# Patient Record
Sex: Female | Born: 1975 | Race: White | Hispanic: Yes | Marital: Single | Smoking: Never smoker
Health system: Southern US, Community
[De-identification: ages and names within clinical notes are randomized; demographics above are authoritative.]

## PROBLEM LIST (undated history)

## (undated) DIAGNOSIS — F329 Major depressive disorder, single episode, unspecified: Secondary | ICD-10-CM

## (undated) DIAGNOSIS — F32A Depression, unspecified: Secondary | ICD-10-CM

---

## 2004-10-05 ENCOUNTER — Emergency Department (HOSPITAL_COMMUNITY): Admission: EM | Admit: 2004-10-05 | Discharge: 2004-10-05 | Payer: Self-pay | Admitting: Emergency Medicine

## 2004-10-27 ENCOUNTER — Inpatient Hospital Stay (HOSPITAL_COMMUNITY): Admission: AD | Admit: 2004-10-27 | Discharge: 2004-10-27 | Payer: Self-pay | Admitting: *Deleted

## 2004-11-13 HISTORY — PX: TUBAL LIGATION: SHX77

## 2004-11-24 ENCOUNTER — Ambulatory Visit (HOSPITAL_COMMUNITY): Admission: RE | Admit: 2004-11-24 | Discharge: 2004-11-24 | Payer: Self-pay | Admitting: *Deleted

## 2005-03-28 ENCOUNTER — Inpatient Hospital Stay (HOSPITAL_COMMUNITY): Admission: AD | Admit: 2005-03-28 | Discharge: 2005-03-28 | Payer: Self-pay | Admitting: *Deleted

## 2005-03-28 ENCOUNTER — Ambulatory Visit: Payer: Self-pay | Admitting: *Deleted

## 2005-04-07 ENCOUNTER — Inpatient Hospital Stay (HOSPITAL_COMMUNITY): Admission: AD | Admit: 2005-04-07 | Discharge: 2005-04-09 | Payer: Self-pay | Admitting: Obstetrics & Gynecology

## 2005-04-07 ENCOUNTER — Inpatient Hospital Stay (HOSPITAL_COMMUNITY): Admission: AD | Admit: 2005-04-07 | Discharge: 2005-04-07 | Payer: Self-pay | Admitting: Obstetrics & Gynecology

## 2005-12-23 ENCOUNTER — Emergency Department (HOSPITAL_COMMUNITY): Admission: EM | Admit: 2005-12-23 | Discharge: 2005-12-23 | Payer: Self-pay | Admitting: Family Medicine

## 2006-04-28 ENCOUNTER — Emergency Department (HOSPITAL_COMMUNITY): Admission: EM | Admit: 2006-04-28 | Discharge: 2006-04-28 | Payer: Self-pay | Admitting: Emergency Medicine

## 2007-03-13 ENCOUNTER — Encounter: Admission: RE | Admit: 2007-03-13 | Discharge: 2007-03-13 | Payer: Self-pay | Admitting: Obstetrics and Gynecology

## 2007-04-30 ENCOUNTER — Encounter: Admission: RE | Admit: 2007-04-30 | Discharge: 2007-04-30 | Payer: Self-pay | Admitting: Obstetrics and Gynecology

## 2007-11-12 ENCOUNTER — Emergency Department (HOSPITAL_COMMUNITY): Admission: EM | Admit: 2007-11-12 | Discharge: 2007-11-13 | Payer: Self-pay | Admitting: Emergency Medicine

## 2007-11-15 ENCOUNTER — Inpatient Hospital Stay (HOSPITAL_COMMUNITY): Admission: AD | Admit: 2007-11-15 | Discharge: 2007-11-15 | Payer: Self-pay | Admitting: Obstetrics and Gynecology

## 2008-01-10 ENCOUNTER — Ambulatory Visit (HOSPITAL_COMMUNITY): Admission: RE | Admit: 2008-01-10 | Discharge: 2008-01-10 | Payer: Self-pay | Admitting: Family Medicine

## 2008-02-06 ENCOUNTER — Ambulatory Visit (HOSPITAL_COMMUNITY): Admission: RE | Admit: 2008-02-06 | Discharge: 2008-02-06 | Payer: Self-pay | Admitting: Gynecology

## 2008-04-19 ENCOUNTER — Inpatient Hospital Stay (HOSPITAL_COMMUNITY): Admission: AD | Admit: 2008-04-19 | Discharge: 2008-04-20 | Payer: Self-pay | Admitting: Obstetrics & Gynecology

## 2008-04-19 ENCOUNTER — Ambulatory Visit: Payer: Self-pay | Admitting: Obstetrics and Gynecology

## 2008-04-29 IMAGING — US US OB COMP LESS 14 WK
1 series · 14 of 28 positions shown · non-contrast
Comparison: none

CLINICAL DATA: Abdominal pain and vomiting.  Positive urine pregnancy test.  
 OBSTETRICAL ULTRASOUND <14 WKS:
TECHNIQUE: Transabdominal ultrasound was performed for evaluation of the gestation as well as the maternal uterus and adnexal regions.

[Series 1: unknown · 0.28mm/px · 14 of 68 slices shown]
[im 3/68]
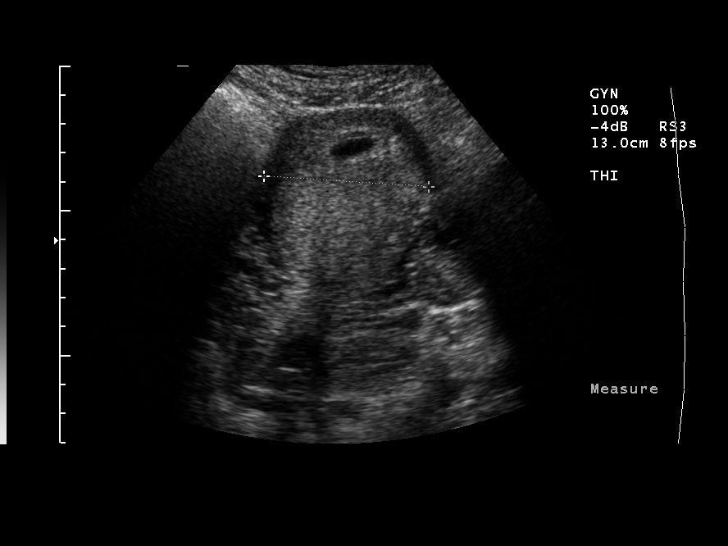
[im 8/68]
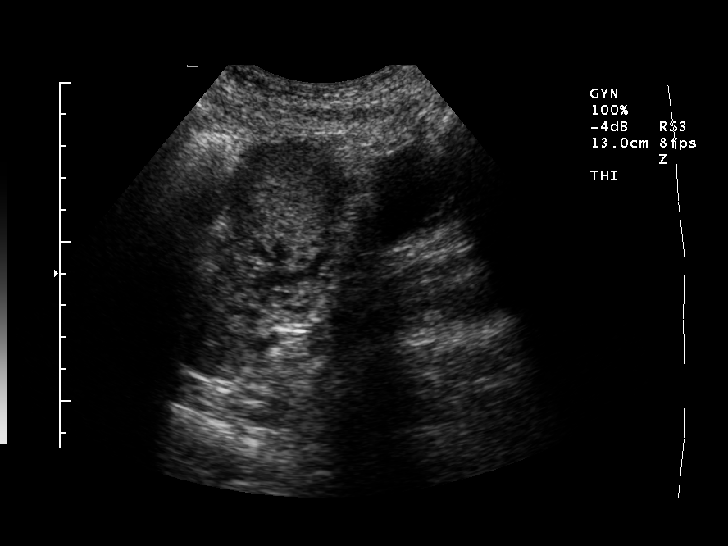
[im 13/68]
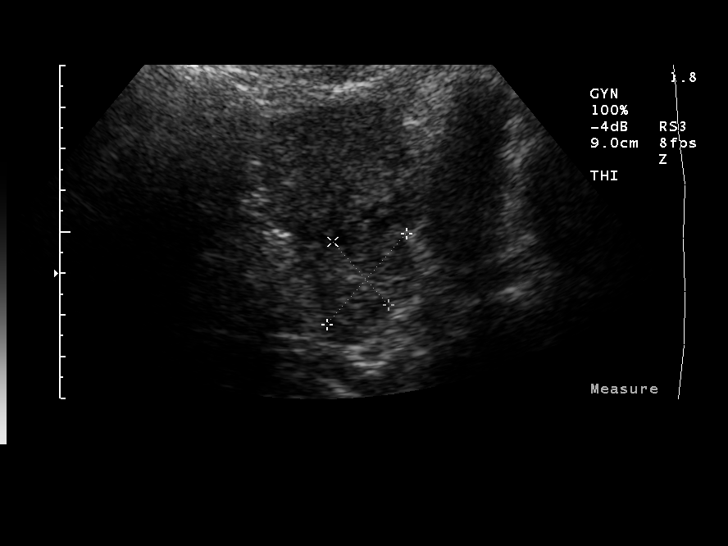
[im 18/68]
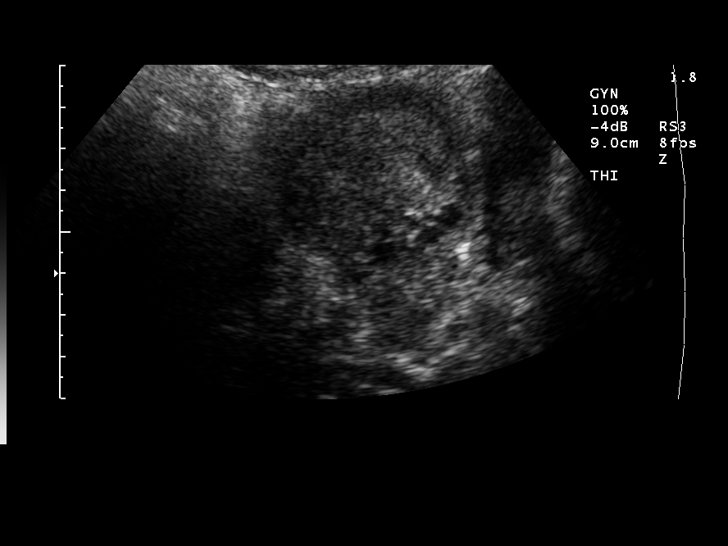
[im 23/68]
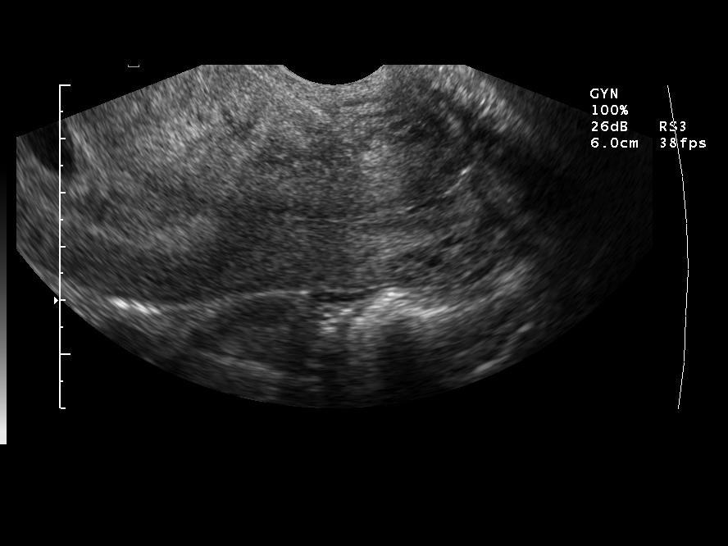
[im 28/68]
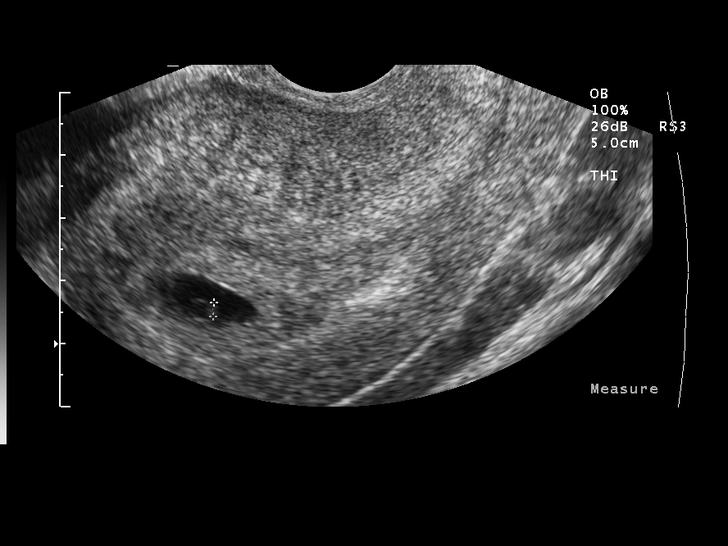
[im 33/68]
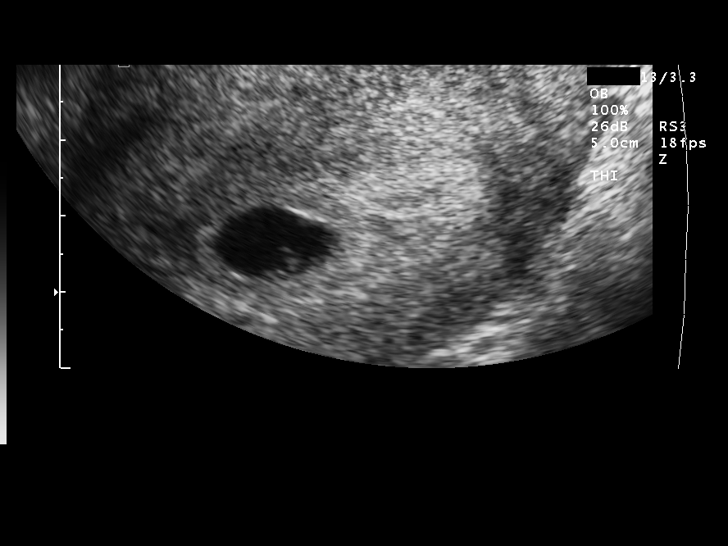
[im 38/68]
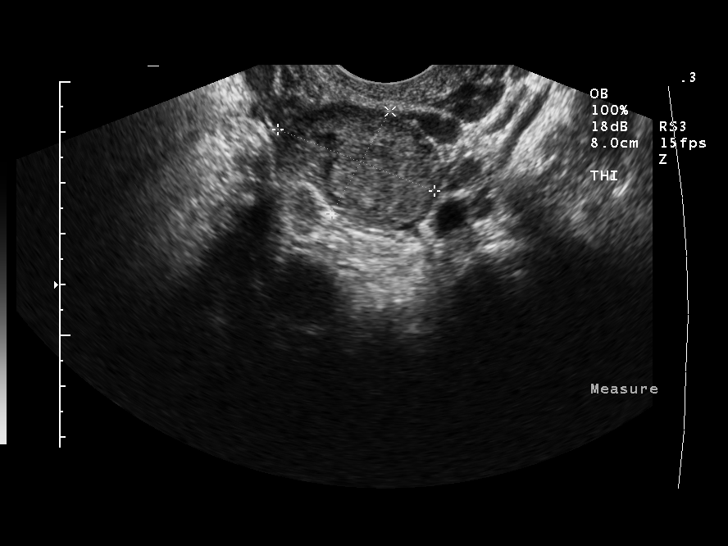
[im 43/68]
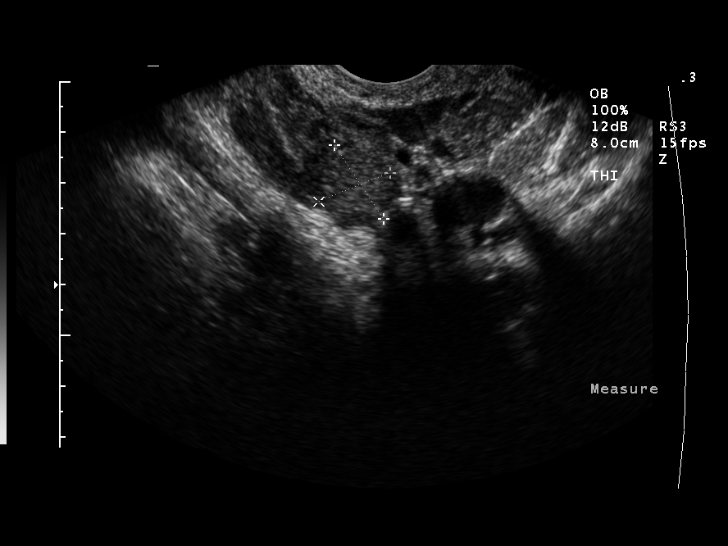
[im 48/68]
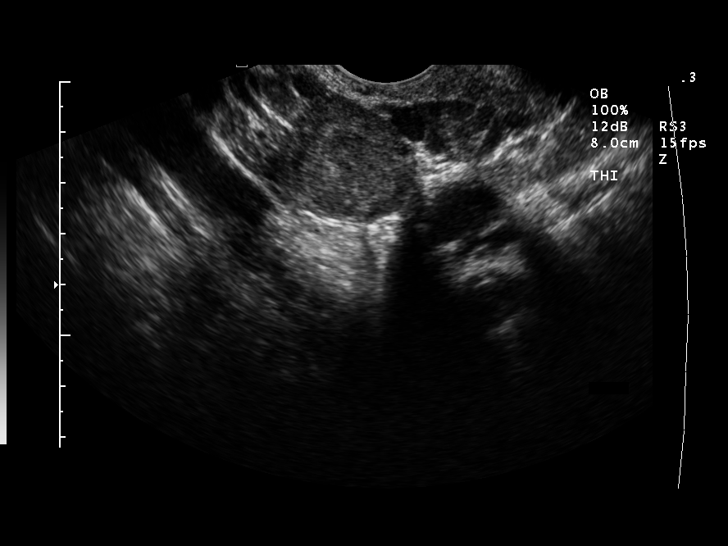
[im 53/68]
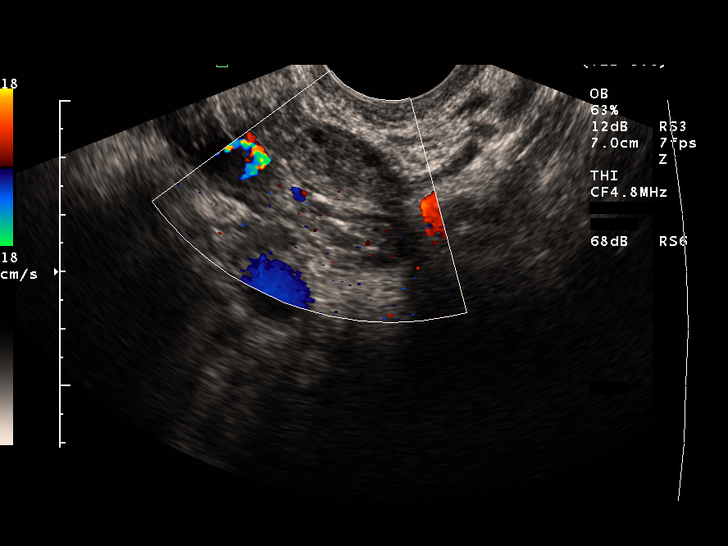
[im 58/68]
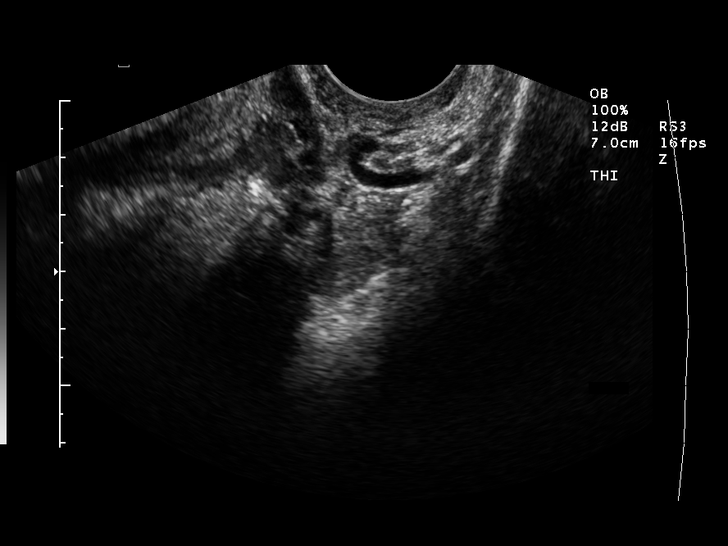
[im 63/68]
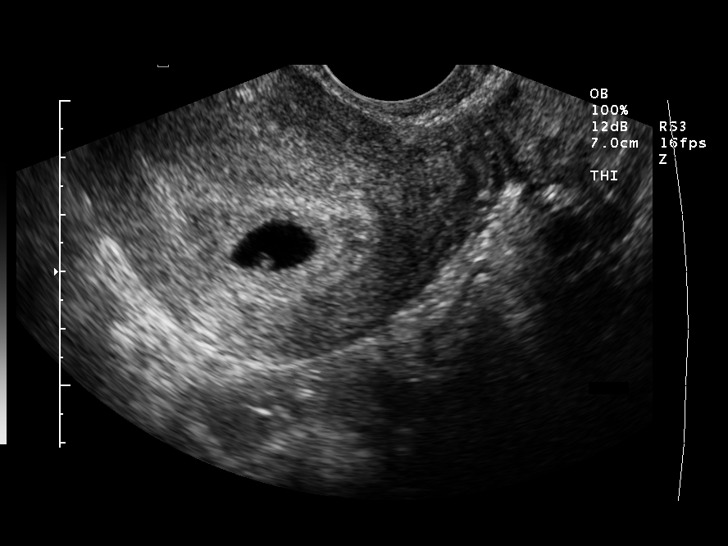
[im 68/68]
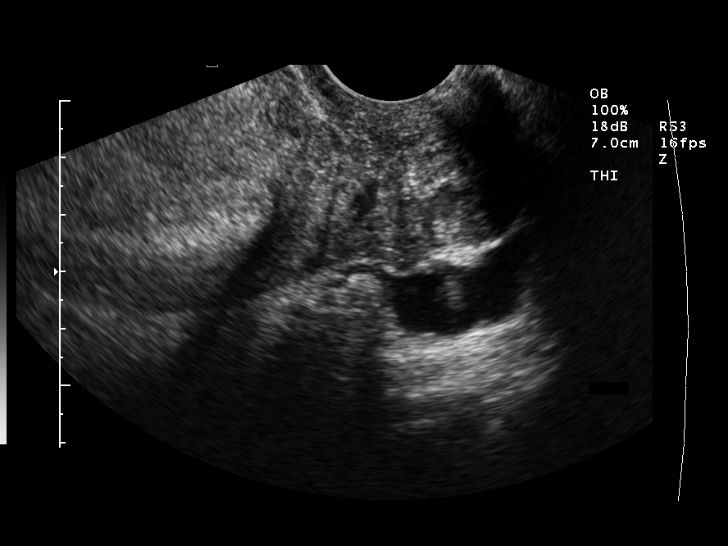

[14 of 28 positions shown; findings below may reference images not displayed]

FINDINGS: There is a single intrauterine pregnancy.  There is an embryo with a crown-rump length of 2.6 mm consistent with 5 weeks 6 days.  There is a heart rate of 104 bpm.  
 There is a trace amount of free fluid in the pelvis.
IMPRESSION: 1.  Intrauterine pregnancy of approximately 5 weeks 6 days gestation.  No evidence of hemorrhage.  
 2.  Vague area of echogenicity in the right ovary which probably represents a hemorrhagic cyst but follow-up is recommended.

## 2008-07-03 ENCOUNTER — Inpatient Hospital Stay (HOSPITAL_COMMUNITY): Admission: AD | Admit: 2008-07-03 | Discharge: 2008-07-03 | Payer: Self-pay | Admitting: Obstetrics & Gynecology

## 2008-07-03 ENCOUNTER — Inpatient Hospital Stay (HOSPITAL_COMMUNITY): Admission: AD | Admit: 2008-07-03 | Discharge: 2008-07-06 | Payer: Self-pay | Admitting: Obstetrics & Gynecology

## 2008-07-03 ENCOUNTER — Ambulatory Visit: Payer: Self-pay | Admitting: Obstetrics and Gynecology

## 2008-07-05 ENCOUNTER — Encounter: Payer: Self-pay | Admitting: Obstetrics & Gynecology

## 2008-07-05 ENCOUNTER — Ambulatory Visit: Payer: Self-pay | Admitting: Vascular Surgery

## 2008-07-08 ENCOUNTER — Emergency Department (HOSPITAL_COMMUNITY): Admission: EM | Admit: 2008-07-08 | Discharge: 2008-07-09 | Payer: Self-pay | Admitting: Emergency Medicine

## 2008-07-10 ENCOUNTER — Emergency Department (HOSPITAL_COMMUNITY): Admission: EM | Admit: 2008-07-10 | Discharge: 2008-07-10 | Payer: Self-pay | Admitting: Emergency Medicine

## 2008-07-10 ENCOUNTER — Encounter (INDEPENDENT_AMBULATORY_CARE_PROVIDER_SITE_OTHER): Payer: Self-pay | Admitting: Emergency Medicine

## 2008-09-07 ENCOUNTER — Ambulatory Visit: Payer: Self-pay | Admitting: Hematology

## 2010-03-25 ENCOUNTER — Emergency Department (HOSPITAL_COMMUNITY): Admission: EM | Admit: 2010-03-25 | Discharge: 2010-03-25 | Payer: Self-pay | Admitting: Emergency Medicine

## 2011-03-31 NOTE — Op Note (Signed)
NAMEChryl Krueger              ACCOUNT NO.:  [id number]   MEDICAL RECORD NO.:  [id number]          PATIENT TYPE:  INP   LOCATION:  9130                          FACILITY:  [facility]   PHYSICIAN:  Lesly Dukes, M.D. DATE OF BIRTH:  1976-05-02   DATE OF PROCEDURE:  04/08/2005  DATE OF DISCHARGE:                                 OPERATIVE REPORT   PREOPERATIVE DIAGNOSIS:  The patient is a 35 year old G3, P3 female desiring  permanent sterilization.   POSTOPERATIVE DIAGNOSIS:  The patient is a 34 year old G47, P3 female  desiring permanent sterilization.   PROCEDURE:  Postpartum bilateral tubal ligation.   SURGEON:  Dr. Elsie Lincoln   ANESTHESIA:  Spinal.   PATHOLOGY:  None.   ESTIMATED BLOOD LOSS:  Minimal.   COMPLICATIONS:  None.   DESCRIPTION OF PROCEDURE:  After informed consent was obtained, using  _______ interpreter, the patient was taken to the operating room where  spinal anesthesia was found to be adequate.  The patient in the dorsal  supine position, and the bladder was in-and-out catheterized.  The abdomen  was prepared and draped in the normal sterile fashion.  An infraumbilical  skin incision was made with a scalpel and carried down bluntly to the  peritoneum.  The peritoneum was entered bluntly and the incision extended  both superiorly and inferiorly.  Retractors were placed in the abdomen, and  the right fallopian tube was grasped with a Babcock clamp, tented up, and  followed out to its fimbriated end.  A Filshie clip was placed across the  entire tube.  There was good hemostasis, and the tube was returned to the  abdomen.  The left fallopian tube was then identified, grasped with a  Babcock, clamped, tented up, and followed out to its fimbriated end.  A  Filshie clip was placed across the entire fallopian tube.  Good hemostasis  was noted.  The tube was returned to the abdomen.  Good hemostasis was  assured one last time.  The fascia was then closed with 0  Vicryl in a  running fashion.  The skin was closed with 4-0 Vicryl in a subcuticular  fashion.  Good hemostasis was noted.  The patient tolerated the procedure  well.  Sponge, lap, instrument, and needle count were correct x 2.  The  patient went to the recovery room in stable condition.      KHL/MEDQ  D:  04/08/2005  T:  04/08/2005  Job:  [id number]

## 2011-08-02 LAB — URINALYSIS, ROUTINE W REFLEX MICROSCOPIC
Bilirubin Urine: NEGATIVE
Glucose, UA: NEGATIVE
Ketones, ur: 15 — AB
Nitrite: NEGATIVE
Protein, ur: NEGATIVE
Specific Gravity, Urine: 1.025
Urobilinogen, UA: 0.2
pH: 5

## 2011-08-02 LAB — URINE MICROSCOPIC-ADD ON

## 2011-08-10 LAB — FETAL FIBRONECTIN: Fetal Fibronectin: NEGATIVE

## 2011-08-18 LAB — WET PREP, GENITAL
Clue Cells Wet Prep HPF POC: NONE SEEN
Trich, Wet Prep: NONE SEEN
Yeast Wet Prep HPF POC: NONE SEEN

## 2011-08-18 LAB — DIFFERENTIAL
Basophils Absolute: 0.1
Basophils Relative: 1
Eosinophils Absolute: 0.1
Eosinophils Relative: 1
Lymphocytes Relative: 25
Lymphs Abs: 2.4
Monocytes Absolute: 0.6
Monocytes Relative: 6
Neutro Abs: 6.5
Neutrophils Relative %: 67

## 2011-08-18 LAB — HCG, QUANTITATIVE, PREGNANCY: hCG, Beta Chain, Quant, S: 16845 — ABNORMAL HIGH

## 2011-08-18 LAB — URINALYSIS, ROUTINE W REFLEX MICROSCOPIC
Bilirubin Urine: NEGATIVE
Glucose, UA: NEGATIVE
Hgb urine dipstick: NEGATIVE
Ketones, ur: NEGATIVE
Nitrite: NEGATIVE
Protein, ur: NEGATIVE
Specific Gravity, Urine: 1.021
Urobilinogen, UA: 1
pH: 7

## 2011-08-18 LAB — GC/CHLAMYDIA PROBE AMP, GENITAL
Chlamydia, DNA Probe: NEGATIVE
GC Probe Amp, Genital: NEGATIVE

## 2011-08-18 LAB — PREGNANCY, URINE: Preg Test, Ur: POSITIVE

## 2011-08-18 LAB — CBC
HCT: 40.2
Hemoglobin: 13.5
MCHC: 33.6
MCV: 94.6
Platelets: 195
RBC: 4.25
RDW: 13.6
WBC: 9.7

## 2011-08-18 LAB — RPR: RPR Ser Ql: NONREACTIVE

## 2011-08-18 LAB — URINE MICROSCOPIC-ADD ON

## 2012-02-15 ENCOUNTER — Encounter (HOSPITAL_COMMUNITY): Payer: Self-pay | Admitting: Emergency Medicine

## 2012-02-15 ENCOUNTER — Emergency Department (HOSPITAL_COMMUNITY)
Admission: EM | Admit: 2012-02-15 | Discharge: 2012-02-16 | Disposition: A | Discharge status: Home / Self Care | Payer: Self-pay | Attending: Emergency Medicine | Admitting: Emergency Medicine

## 2012-02-15 DIAGNOSIS — M79609 Pain in unspecified limb: Secondary | ICD-10-CM | POA: Insufficient documentation

## 2012-02-15 DIAGNOSIS — M79606 Pain in leg, unspecified: Secondary | ICD-10-CM

## 2012-02-15 DIAGNOSIS — R079 Chest pain, unspecified: Secondary | ICD-10-CM | POA: Insufficient documentation

## 2012-02-15 DIAGNOSIS — R0781 Pleurodynia: Secondary | ICD-10-CM

## 2012-02-15 LAB — CBC
Hemoglobin: 14.7 g/dL (ref 12.0–15.0)
MCH: 31.6 pg (ref 26.0–34.0)
MCHC: 36.8 g/dL — ABNORMAL HIGH (ref 30.0–36.0)
Platelets: 193 10*3/uL (ref 150–400)
Platelets: 204 10*3/uL (ref 150–400)
RBC: 4.71 MIL/uL (ref 3.87–5.11)
RDW: 13.1 % (ref 11.5–15.5)
RDW: 13 % (ref 11.5–15.5)
WBC: 10.4 10*3/uL (ref 4.0–10.5)

## 2012-02-15 LAB — DIFFERENTIAL
Basophils Absolute: 0 10*3/uL (ref 0.0–0.1)
Basophils Relative: 0 % (ref 0–1)
Eosinophils Absolute: 0.1 10*3/uL (ref 0.0–0.7)
Neutro Abs: 6.1 10*3/uL (ref 1.7–7.7)
Neutrophils Relative %: 62 % (ref 43–77)

## 2012-02-15 LAB — URINE MICROSCOPIC-ADD ON

## 2012-02-15 LAB — BASIC METABOLIC PANEL
Chloride: 103 mEq/L (ref 96–112)
GFR calc Af Amer: >90 mL/min (ref >90)
GFR calc non Af Amer: >90 mL/min (ref >90)
Potassium: 4 mEq/L (ref 3.5–5.1)

## 2012-02-15 LAB — URINALYSIS, ROUTINE W REFLEX MICROSCOPIC
Ketones, ur: NEGATIVE mg/dL
Nitrite: NEGATIVE
Protein, ur: NEGATIVE mg/dL
Urobilinogen, UA: 1 mg/dL (ref 0.0–1.0)
pH: 8 (ref 5.0–8.0)

## 2012-02-15 LAB — POCT PREGNANCY, URINE: Preg Test, Ur: NEGATIVE

## 2012-02-15 NOTE — ED Notes (Signed)
PT. REPORTS LEFT ABDOMINAL Gilmore Laroche PAIN ONSET TODAY WITH HEADACHE AND LEFT THIGH PAIN . DENIES NAUSEA OR VOMITTING , NO FEVER OR CHILLS.

## 2012-02-15 NOTE — ED Notes (Signed)
MD at bedside for assessment

## 2012-02-15 NOTE — ED Notes (Signed)
Patient denies nausea/vomiting or diarrhea, fever, or chills.

## 2012-02-16 ENCOUNTER — Emergency Department (HOSPITAL_COMMUNITY): Payer: Self-pay

## 2012-02-16 MED ORDER — TRAMADOL HCL 50 MG PO TABS: 50.0000 mg | ORAL_TABLET | Freq: Four times a day (QID) | ORAL | Status: AC | PRN

## 2012-02-16 MED ORDER — TRAMADOL HCL 50 MG PO TABS
50.0000 mg | ORAL_TABLET | Freq: Once | ORAL | Status: AC
  Administered 2012-02-16: 50 mg via ORAL
  Filled 2012-02-16: qty 1

## 2012-02-16 NOTE — ED Provider Notes (Addendum)
History     CSN: [id number]  Arrival date & time 02/15/12  1919   First MD Initiated Contact with Patient 02/15/12 2209      Chief Complaint  Patient presents with  . Abdominal Pain    (Consider location/radiation/quality/duration/timing/severity/associated sxs/prior treatment) HPI Comments: PT. REPORTS LEFT ABDOMINAL /FLANK PAIN ONSET TODAY WITH HEADACHE AND LEFT THIGH PAIN . DENIES NAUSEA OR VOMITTING , NO FEVER OR CHILLS.  Has taken over-the-counter ibuprofen, without relief  The history is provided by the patient.    History reviewed. No pertinent past medical history.  History reviewed. No pertinent past surgical history.  No family history on file.  History  Substance Use Topics  . Smoking status: Never Smoker   . Smokeless tobacco: Not on file  . Alcohol Use: No    OB History    Grav Para Term Preterm Abortions TAB SAB Ect Mult Living                  Review of Systems  Constitutional: Negative for fever and chills.  Gastrointestinal: Positive for abdominal pain. Negative for nausea, vomiting and diarrhea.  Genitourinary: Negative for dysuria.  Musculoskeletal: Positive for myalgias. Negative for joint swelling.  Skin: Negative for pallor.  Neurological: Positive for headaches.    Allergies  Review of patient's allergies indicates no known allergies.  Home Medications   Current Outpatient Rx  Name Route Sig Dispense Refill  . IBUPROFEN 200 MG PO TABS Oral Take 200 mg by mouth every 6 (six) hours as needed. For pain or fever    . TRAMADOL HCL 50 MG PO TABS Oral Take 1 tablet (50 mg total) by mouth every 6 (six) hours as needed for pain. 15 tablet 0    BP 115/79  Pulse 77  Temp(Src) 98.2 F (36.8 C) (Oral)  Resp 16  SpO2 98%  LMP 02/03/2012  Physical Exam  Constitutional: She is oriented to person, place, and time. She appears well-developed and well-nourished.  Eyes: Pupils are equal, round, and reactive to light.  Cardiovascular: Normal  rate.   Pulmonary/Chest: Effort normal.  Abdominal: Soft. She exhibits no distension. There is no tenderness.  Neurological: She is alert and oriented to person, place, and time.    ED Course  Procedures (including critical care time)  Labs Reviewed  URINALYSIS, ROUTINE W REFLEX MICROSCOPIC - Abnormal; Notable for the following:    APPearance CLOUDY (*)    Leukocytes, UA TRACE (*)    All other components within normal limits  CBC - Abnormal; Notable for the following:    MCHC 36.8 (*)    All other components within normal limits  BASIC METABOLIC PANEL - Abnormal; Notable for the following:    Glucose, Bld 106 (*)    All other components within normal limits  URINE MICROSCOPIC-ADD ON - Abnormal; Notable for the following:    Squamous Epithelial / LPF FEW (*)    Bacteria, UA MANY (*)    All other components within normal limits  DIFFERENTIAL  POCT PREGNANCY, URINE  D-DIMER, QUANTITATIVE  CBC   Dg Chest 2 View  02/16/2012  *RADIOLOGY REPORT*  Clinical Data: Left-sided chest pain.  CHEST - 2 VIEW  Comparison: None  Findings: The cardiac silhouette, mediastinal and hilar contours are normal.  The lungs are clear.  No pleural effusion.  Bony thorax is intact.  IMPRESSION: Normal chest x-ray.  Original Report Authenticated By: P. Loralie Champagne, M.D.     1. Lower extremity pain  2. Rib pain       MDM  Nonspecific pain, left leg pain, radiating to left rib        Arman Filter, NP 02/16/12 0059  Arman Filter, NP 04/16/12 2101

## 2012-02-16 NOTE — Discharge Instructions (Signed)
Today her labs were normal your chest x-ray is normal.  We evaluated for potential blood clot in your leg, and lung tests are all negative.  You have been prescribed medication to control your pain you also been given a list of physicians.  Please try to establish regular medical care RESOURCE GUIDE  Dental Problems  Patients with Medicaid: [facility] [phone] [address]                                           [phone] [address] Phone:  [phone]                                                  Phone:  [phone]  If unable to pay or uninsured, contact:  [facility] or [facility]. to become qualified for the adult dental clinic.  Chronic Pain Problems Contact [facility]  [phone] Patients need to be referred by their primary care doctor.  Insufficient Money for Medicine Contact [employer] Way:  call "211" or [facility] [phone].  No Primary Care Doctor Call [facility]  [phone] Other agencies that provide inexpensive medical care    [facility]  [phone]    [facility]  [phone]    [facility]  [phone]    [facility]  [phone]    [facility]  [phone]    [facility]  [phone]  Psychological Services [facility]  [phone] [facility] Services  [phone] [facility] Mental Health   [phone] (emergency services [phone])  Substance Abuse Resources [facility]  [phone] [facility] [phone] [facility] [phone] Floydene Flock [phone] Residential & Outpatient Substance Abuse Program  [phone]  Abuse/Neglect [facility] Child Abuse Hotline 838-476-6106 [facility] Child Abuse Hotline [phone] (After Hours)  Emergency Shelter [facility] [phone]  Maternity Homes [facility] at [facility] [place] [facility] [phone] [facility] [phone]  MRSA Hotline #:   (567) 735-7548    [facility] Resources  [facility] [place]     [employer]                          [facility] Dept. [address] [place]                       [address]      [address]  [place]                                                Cristobal Goldmann Phone:  [phone]  Phone:  [phone]                 Phone:  [phone]  [facility] Mental [facility] Phone:  [phone]  [facility] Child Abuse Hotline 587-120-8189 [phone] (After Hours)

## 2012-02-17 NOTE — ED Provider Notes (Signed)
Medical screening examination/treatment/procedure(s) were performed by non-physician practitioner and as supervising physician I was immediately available for consultation/collaboration.  Loren Racer, MD 02/17/12 [phone]

## 2012-03-19 DIAGNOSIS — N6481 Ptosis of breast: Secondary | ICD-10-CM

## 2012-03-19 HISTORY — DX: Ptosis of breast: N64.81

## 2012-04-17 NOTE — ED Provider Notes (Signed)
Medical screening examination/treatment/procedure(s) were performed by non-physician practitioner and as supervising physician I was immediately available for consultation/collaboration.   Loren Racer, MD 04/17/12 2231

## 2014-05-13 LAB — CYTOLOGY - PAP: Pap: NEGATIVE

## 2014-08-07 ENCOUNTER — Encounter (HOSPITAL_COMMUNITY): Payer: Self-pay | Admitting: Emergency Medicine

## 2014-08-07 ENCOUNTER — Emergency Department (HOSPITAL_COMMUNITY)
Admission: EM | Admit: 2014-08-07 | Discharge: 2014-08-07 | Disposition: A | Discharge status: Home / Self Care | Payer: Medicaid Other | Attending: Emergency Medicine | Admitting: Emergency Medicine

## 2014-08-07 DIAGNOSIS — R51 Headache: Secondary | ICD-10-CM | POA: Insufficient documentation

## 2014-08-07 DIAGNOSIS — R253 Fasciculation: Secondary | ICD-10-CM

## 2014-08-07 DIAGNOSIS — R259 Unspecified abnormal involuntary movements: Secondary | ICD-10-CM | POA: Diagnosis not present

## 2014-08-07 NOTE — ED Provider Notes (Signed)
CSN: [id number]     Arrival date & time 08/07/14  1853 History  This chart was scribed for non-physician practitioner working with Flint Melter, MD by Elveria Rising, ED Scribe. This patient was seen in room TR05C/TR05C and the patient's care was started at 8:16 PM.   Chief Complaint  Patient presents with  . Headache    The history is provided by the patient. No language interpreter was used.   HPI Comments: Sara Krueger is a 38 y.o. female who presents to the Emergency Department with chief complaint of the left facial twitching located at left eyebrow, left eye (blinking) and cheek onset three days ago. Patient reports associated headache yesterday that has since resolved.  Patient reports increased stress recently. Patient denies fever, cough, sore throat, chest pain, shortness of breath, or difficulty breathing.  Patient denies alcohol or tobacco use.   History reviewed. No pertinent past medical history. History reviewed. No pertinent past surgical history. No family history on file. History  Substance Use Topics  . Smoking status: Never Smoker   . Smokeless tobacco: Not on file  . Alcohol Use: No   OB History   Grav Para Term Preterm Abortions TAB SAB Ect Mult Living                 Review of Systems  Constitutional: Negative for fever and chills.  Respiratory: Negative for cough and shortness of breath.   Cardiovascular: Negative for chest pain.  Gastrointestinal: Negative for nausea, vomiting, diarrhea and constipation.  Genitourinary: Negative for dysuria.  Neurological: Negative for facial asymmetry and headaches.      Allergies  Review of patient's allergies indicates no known allergies.  Home Medications   Prior to Admission medications   Medication Sig Start Date End Date Taking? Authorizing Provider  ibuprofen (ADVIL,MOTRIN) 200 MG tablet Take 200 mg by mouth every 6 (six) hours as needed. For pain or fever    Historical Provider, MD   Triage  Vitals: BP 126/84  Pulse 87  Temp(Src) 98.7 F (37.1 C) (Oral)  Resp 22  Ht  (1.702 m)  Wt 130 lb (58.968 kg)  BMI 20.36 kg/m2  SpO2 100%  LMP 07/17/2014  Physical Exam  Nursing note and vitals reviewed. Constitutional: She is oriented to person, place, and time. She appears well-developed and well-nourished. No distress.  HENT:  Head: Normocephalic and atraumatic.  Right Ear: External ear normal.  Left Ear: External ear normal.  Eyes: Conjunctivae and EOM are normal. Pupils are equal, round, and reactive to light.  Neck: Normal range of motion. Neck supple.  No pain with neck flexion, no meningismus  Cardiovascular: Normal rate, regular rhythm, normal heart sounds and intact distal pulses.  Exam reveals no gallop and no friction rub.   No murmur heard. Pulmonary/Chest: Effort normal and breath sounds normal. No respiratory distress. She has no wheezes. She has no rales. She exhibits no tenderness.  Abdominal: Soft. She exhibits no distension and no mass. There is no tenderness. There is no rebound and no guarding.  Musculoskeletal: Normal range of motion. She exhibits no edema and no tenderness.  Normal gait.  Neurological: She is alert and oriented to person, place, and time. She has normal reflexes. No cranial nerve deficit.  CN 3-12 intact, normal finger to nose, no pronator drift, sensation and strength intact bilaterally.  Skin: Skin is warm and dry.  Psychiatric: She has a normal mood and affect. Her behavior is normal. Judgment and thought content  normal.    ED Course  Procedures (including critical care time)  COORDINATION OF CARE: 8:18 PM- Patient advised to drink more water, get more rest and to distress if possible. Discussed treatment plan with patient at bedside and patient agreed to plan.   Labs Review Labs Reviewed - No data to display  Imaging Review No results found.   EKG Interpretation None      MDM   Final diagnoses:  Eyelid twitch     Patient symptoms consistent with blepharospasm.  Lifestyle modifications discussed.  Reducing stress.  Recommend PCP follow-up.  I personally performed the services described in this documentation, which was scribed in my presence. The recorded information has been reviewed and is accurate.    Roxy Horseman, PA-C 08/07/14 2026

## 2014-08-07 NOTE — ED Provider Notes (Signed)
Medical screening examination/treatment/procedure(s) were performed by non-physician practitioner and as supervising physician I was immediately available for consultation/collaboration.  Flint Melter, MD 08/07/14 2329

## 2014-08-07 NOTE — Discharge Instructions (Signed)
Blepharospasm Blepharospasm is an abnormal, involuntary blinking, movement, or spasm of the eyelids. CAUSES  Anyone can have an eye twitch from time to time. In most cases, there is no clear cause. Eyelid spasms may be associated with or prolonged by:   Alcohol.  Caffeine.  Fatigue.  Irritation of the eye surface or inner eyelids.  Lack of sleep.  Physical exertion.  Smoking.  Stress. Chronic, uncontrollable eyelid movement affecting both eyes is known as benign essential blepharospasm. This is not a common problem. Although its exact cause is unknown, the following conditions sometimes come before or during a problem of benign essential blepharospasm:   Eyelid infection (blepharitis).  Dry eyes.  Light sensitivity.  Pink eye (conjunctivitis). Rarely, eye twitch may be a sign of certain brain and nerve disorders. When it is, there are almost always other signs and symptoms. Brain and nerve disorders that can cause eye twitch include:   Bell's palsy.  Benign essential blepharospasm.  Dystonia.  Parkinson's Disease.  Side effects of drugs, particularly medications used to treat epilepsy and psychosis.  Spasmodic torticollis (a different type of muscle spasm sometimes accompanied by blepharospasm).  Tourette syndrome. SYMPTOMS  Random eye twitching can come and go for a few days, weeks or months. The spasms do not hurt, but they can be annoying. In its most common, harmless form, eye twitching stops on its own. It may recur off and on for no reason. The onset of blepharospasm may include:   The development of blepharospasm without any warning signs.  A gradual increase in blinking or eye irritation.  Other symptoms including:  Fatigue.  Emotional tension.  Sensitivity to bright light. Symptoms may lessen or stop while:  A person is sleeping.  Concentrating on a specific task. If the condition progresses, the symptoms become more frequent. Facial spasms may  develop. This is rare and may be the earliest sign of a more chronic movement disorder. TREATMENT  To date, there is no successful cure. Several treatment options can reduce its severity.  In some places, Botox shots into the muscles of the eyelids is an approved treatment. Botulinum toxin temporarily paralyzes the muscles of the eyelids.  Other medications can have unpredictable results. Any symptom relief is usually short term. They tend to be helpful in only a small percentage of cases.  A surgical procedure to remove some of the muscles and nerves of the eyelids (myectomy) is also an option. This surgery has improved symptoms in 75 to 85 percent of people.  Alternative treatments (the benefits of these alternative therapies have not been proven) may include:  Biofeedback.  Acupuncture.  Hypnosis.  Chiropractic treatment.  Nutritional therapy. FOR MORE INFORMATION National Eye Institute: [email or web address] Document Released: 11/02/2003 Document Revised: 01/22/2012 Document Reviewed: 09/17/2007 [facility] Patient Information 2015 [place], [place]. This information is not intended to replace advice given to you by your health care provider. Make sure you discuss any questions you have with your health care provider.

## 2014-08-07 NOTE — ED Notes (Signed)
Sx onset 3d ago. Reports recent HA (resolved), c/o facial twitching. Not noticeable at this time. Pinpoints to L eye brow, L face and L tongue. Speaks English, not primary language. Neuro intact. PERRLA. EOMI. Facial symmetry, no droop. Speech clear.

## 2016-05-19 ENCOUNTER — Encounter: Payer: Self-pay | Admitting: *Deleted

## 2016-06-07 ENCOUNTER — Encounter: Payer: Self-pay | Admitting: Obstetrics & Gynecology

## 2016-06-07 ENCOUNTER — Other Ambulatory Visit (HOSPITAL_COMMUNITY)
Admission: RE | Admit: 2016-06-07 | Discharge: 2016-06-07 | Disposition: A | Discharge status: Home / Self Care | Payer: Medicaid Other | Source: Ambulatory Visit | Attending: Obstetrics & Gynecology | Admitting: Obstetrics & Gynecology

## 2016-06-07 ENCOUNTER — Ambulatory Visit (INDEPENDENT_AMBULATORY_CARE_PROVIDER_SITE_OTHER): Payer: Medicaid Other | Admitting: Obstetrics & Gynecology

## 2016-06-07 VITALS — BP 125/87 | HR 86 | Ht 67.0 in | Wt 138.0 lb

## 2016-06-07 DIAGNOSIS — Z3009 Encounter for other general counseling and advice on contraception: Secondary | ICD-10-CM

## 2016-06-07 DIAGNOSIS — N930 Postcoital and contact bleeding: Secondary | ICD-10-CM | POA: Diagnosis not present

## 2016-06-07 DIAGNOSIS — Z711 Person with feared health complaint in whom no diagnosis is made: Secondary | ICD-10-CM

## 2016-06-07 DIAGNOSIS — Z113 Encounter for screening for infections with a predominantly sexual mode of transmission: Secondary | ICD-10-CM | POA: Insufficient documentation

## 2016-06-07 NOTE — Progress Notes (Signed)
   Subjective:    Patient ID: Sara Krueger, female    DOB: [birth year], 40 y.o.   MRN: [id number]  HPI 96 S H P4 (18, 12, 10, and 13 yo kids) here today to discuss contraception. She has had a BTL after her 3rd child but it failed and then she had her 4th. Since then she tried depo provera (didn't feel well with it), OCPs (also didn't feel well with this), Mirena (caused her a lot of pain). She had it removed after about 6 months. She is now using condoms. But one broke about 2 weeks ago. She used Plan B and has had her period since then.  She has had post coital bleeding for about 6 months. She believes that they are monogamous for about 14 months.   Review of Systems     Objective:   Physical Exam WNWHHFNAD Breathing, conversing, and ambulating normally Abd- benign Ectropion noted of cervix Normal vaginal discharge       Assessment & Plan:  FAILED BTL- plan for l/s BS. She will sign BTL forms today  PCB- check cervical cultures Cryo prn

## 2016-06-08 ENCOUNTER — Encounter: Payer: Self-pay | Admitting: *Deleted

## 2016-06-08 LAB — GC/CHLAMYDIA PROBE AMP (~~LOC~~) NOT AT ARMC
Chlamydia: NEGATIVE
NEISSERIA GONORRHEA: NEGATIVE

## 2016-06-16 ENCOUNTER — Encounter (HOSPITAL_COMMUNITY): Payer: Self-pay | Admitting: *Deleted

## 2016-07-04 ENCOUNTER — Telehealth: Payer: Self-pay

## 2016-07-04 NOTE — Telephone Encounter (Signed)
Pt called requesting results for her pap smear.  I informed pt that it has not resulted at this time.  I informed her that if it is abnormal then we will call her with a f/u and if it is normal a letter will be sent out to her.  Pt stated understanding with no further questions.

## 2016-07-07 ENCOUNTER — Encounter: Payer: Self-pay | Admitting: *Deleted

## 2016-07-14 ENCOUNTER — Encounter (HOSPITAL_COMMUNITY): Payer: Self-pay | Admitting: *Deleted

## 2016-07-23 ENCOUNTER — Encounter (HOSPITAL_COMMUNITY): Payer: Self-pay | Admitting: Anesthesiology

## 2016-07-24 ENCOUNTER — Ambulatory Visit (HOSPITAL_COMMUNITY)
Admission: RE | Admit: 2016-07-24 | Payer: Medicaid Other | Source: Ambulatory Visit | Admitting: Obstetrics & Gynecology

## 2016-07-24 ENCOUNTER — Telehealth: Payer: Self-pay | Admitting: *Deleted

## 2016-07-24 HISTORY — DX: Depression, unspecified: F32.A

## 2016-07-24 HISTORY — DX: Major depressive disorder, single episode, unspecified: F32.9

## 2016-07-24 SURGERY — SALPINGECTOMY, BILATERAL, LAPAROSCOPIC
Anesthesia: Choice | Site: Abdomen | Laterality: Bilateral

## 2016-07-24 NOTE — Telephone Encounter (Signed)
Pt scheduled to have tubal today. Sara Krueger from pre op called and stated that patient wanted to speak with a nurse. Pt put on phone with me. She states that she is worried about having the procedure today because she has been having pelvic pain and is concerned that if she has cancer the surgery would worsen her situation. Advised patient that it is her choice to not have surgery and that she would need to schedule a follow up appointment with us to discuss her concerns.

## 2016-08-08 ENCOUNTER — Encounter: Payer: Self-pay | Admitting: Obstetrics & Gynecology

## 2016-08-08 ENCOUNTER — Ambulatory Visit (INDEPENDENT_AMBULATORY_CARE_PROVIDER_SITE_OTHER): Payer: Medicaid Other | Admitting: Obstetrics & Gynecology

## 2016-08-08 VITALS — BP 114/75 | HR 80 | Wt 136.0 lb

## 2016-08-08 DIAGNOSIS — R102 Pelvic and perineal pain: Secondary | ICD-10-CM

## 2016-08-08 DIAGNOSIS — N930 Postcoital and contact bleeding: Secondary | ICD-10-CM | POA: Diagnosis not present

## 2016-08-08 MED ORDER — IBUPROFEN 600 MG PO TABS: 600.0000 mg | ORAL_TABLET | Freq: Three times a day (TID) | ORAL | 1 refills | Status: DC | PRN

## 2016-08-08 NOTE — Progress Notes (Signed)
   Subjective:    Patient ID: Sara Krueger, female    DOB: 09/24/1976, 40 y.o.   MRN: [id number]  HPI 40 yo SH P4 here to discuss pelvic pain. This has been present for about 6 months. She says that the pain is all day long, every day. She has not tried any OTC meds as "I don't like to take any medications." She says that the pain is much worse with her periods. She also has some bleeding with sex (cervical cultures were negative 06/07/16).   Review of Systems She was scheduled to have her tubes removed (She had a failed tubal ligation prior to her last child) last month but canceled, fearing that her pain was a sign of cancer and that a surgery could make it worse.    Objective:   Physical Exam WNWHHFNAD Breathing, conversing, and ambulating normally Abd- benign Cervix- ectropion noted Bimanual-NSSR, limited mobility, some tenderness with deep palpation       Assessment & Plan:  Pelvic pain/DUB- check us, tsh Rec IBU 600 mg po q8 hours RTC I suspect endometriosis as the cause of her pain.

## 2016-08-14 ENCOUNTER — Ambulatory Visit (HOSPITAL_COMMUNITY)
Admission: RE | Admit: 2016-08-14 | Discharge: 2016-08-14 | Disposition: A | Discharge status: Home / Self Care | Payer: Medicaid Other | Source: Ambulatory Visit | Attending: Obstetrics & Gynecology | Admitting: Obstetrics & Gynecology

## 2016-08-14 DIAGNOSIS — R102 Pelvic and perineal pain: Secondary | ICD-10-CM | POA: Insufficient documentation

## 2016-08-24 ENCOUNTER — Telehealth: Payer: Self-pay | Admitting: *Deleted

## 2016-08-24 NOTE — Telephone Encounter (Signed)
-----   Message from Olevia BowensJacinda S Battle sent at 08/23/2016  9:53 AM EDT ----- Regarding: Results Contact: [phone] Wants to talk to someone about the ultrasound she had done

## 2016-08-24 NOTE — Telephone Encounter (Signed)
Informed pt of normal US result.  Scheduled follow-up with Dr Marice Potterove to discuss further options.

## 2016-09-05 ENCOUNTER — Ambulatory Visit: Payer: Medicaid Other | Admitting: Obstetrics & Gynecology

## 2017-12-03 ENCOUNTER — Encounter: Payer: Self-pay | Admitting: Radiology

## 2017-12-20 ENCOUNTER — Ambulatory Visit (INDEPENDENT_AMBULATORY_CARE_PROVIDER_SITE_OTHER): Payer: Medicaid Other | Admitting: Obstetrics & Gynecology

## 2017-12-20 ENCOUNTER — Other Ambulatory Visit (HOSPITAL_COMMUNITY)
Admission: RE | Admit: 2017-12-20 | Discharge: 2017-12-20 | Disposition: A | Discharge status: Home / Self Care | Payer: Medicaid Other | Source: Ambulatory Visit | Attending: Obstetrics & Gynecology | Admitting: Obstetrics & Gynecology

## 2017-12-20 ENCOUNTER — Encounter: Payer: Self-pay | Admitting: Obstetrics & Gynecology

## 2017-12-20 VITALS — BP 117/75 | Wt 138.0 lb

## 2017-12-20 DIAGNOSIS — Z0000 Encounter for general adult medical examination without abnormal findings: Secondary | ICD-10-CM | POA: Insufficient documentation

## 2017-12-20 DIAGNOSIS — N938 Other specified abnormal uterine and vaginal bleeding: Secondary | ICD-10-CM

## 2017-12-20 NOTE — Progress Notes (Addendum)
Patient ID: Stephannie PetersAdelmia A Veras, female   DOB: 12/13/1975, 42 y.o.   MRN: [id number]  Chief Complaint  Patient presents with  . Discuss Hysterectomy    HPI Ahja A Luanne BrasVeras is a 42 y.o. female.  Single from [place] (20, 15, 12, and 319 yo kids) here today to discuss her pelvic pain. This has been present for years. I ordered an ultrasound in 2017 which was normal. The pain is worse with her periods. She has pain with sex for at least 2 years. She has been with her current partner for almost 3 years. Cultures were negative at her last visit in 2017.  She also reports irregular periods for the last year. She reports that sometimes she will bleed for 3 months straight. She borrowed her friend's OCPs and 2 took pills and her period stopped.   She uses condoms prn HPI  Past Medical History:  Diagnosis Date  . Depression    no meds  . SVD (spontaneous vaginal delivery)    x 4    Past Surgical History:  Procedure Laterality Date  . TUBAL LIGATION  2006   Failed Tubal    No family history on file.  Social History Social History   Tobacco Use  . Smoking status: Never Smoker  . Smokeless tobacco: Never Used  Substance Use Topics  . Alcohol use: No  . Drug use: No    No Known Allergies  Current Outpatient Medications  Medication Sig Dispense Refill  . aspirin 81 MG tablet Take 81 mg by mouth daily.    Marland Kitchen. ibuprofen (ADVIL,MOTRIN) 600 MG tablet Take 1 tablet (600 mg total) by mouth every 8 (eight) hours as needed. (Patient not taking: Reported on 12/20/2017) 60 tablet 1   No current facility-administered medications for this visit.     Review of Systems Review of Systems  Blood pressure 117/75, weight 138 lb (62.6 kg).  Physical Exam Physical Exam Breathing, conversing, and ambulating normally Well nourished, well hydrated Hispanic female, no apparent distress normal size and shape, retroverted, mobile, non-tender, normal adnexal exam  Copious white discharge  Data  Reviewed IMPRESSION: 1. No findings to explain patient's pelvic pain and bleeding. If bleeding remains unresponsive to hormonal or medical therapy, sonohysterogram should be considered for focal lesion work-up. (Ref: Radiological Reasoning: Algorithmic Workup of Abnormal Vaginal Bleeding with Endovaginal Sonography and Sonohysterography. AJR 2008; 409:[id number]) 2. Nonvisualization of the endometrium. Patient refused transvaginal portion of the exam.   Electronically Signed   By: Signa Kellaylor  Stroud M.D.   On: 08/14/2016 09:07  Assessment   DUB- She declines EMBX today so she will schedule this Preventative care- pap smear today, mammogram She declines a flu vaccine Pelvic pain/dyspareunia/dysmenorrhea- check gyn ultrasound  Plan   Wet prep I offered to prescribe her OCPs but she reports that when she took her friend's OCPs her "heart beat very hard and I had some pain". She denies any pain except during that time.   Refer to her fam med doc    Biopsy in 2 weeks   Allie BossierMyra C Kora Groom 12/20/2017, 9:43 AM

## 2017-12-21 LAB — CYTOLOGY - PAP
BACTERIAL VAGINITIS: POSITIVE — AB
Candida vaginitis: NEGATIVE
Chlamydia: NEGATIVE
DIAGNOSIS: NEGATIVE
HPV (WINDOPATH): DETECTED — AB
Neisseria Gonorrhea: NEGATIVE
Trichomonas: NEGATIVE

## 2017-12-21 LAB — CBC
Hematocrit: 42.3 % (ref 34.0–46.6)
Hemoglobin: 14.7 g/dL (ref 11.1–15.9)
MCH: 33.1 pg — AB (ref 26.6–33.0)
MCHC: 34.8 g/dL (ref 31.5–35.7)
MCV: 95 fL (ref 79–97)
Platelets: 198 10*3/uL (ref 150–379)
RBC: 4.44 x10E6/uL (ref 3.77–5.28)
RDW: 13.7 % (ref 12.3–15.4)
WBC: 6.9 10*3/uL (ref 3.4–10.8)

## 2017-12-21 LAB — TSH: TSH: 1.39 u[IU]/mL (ref 0.450–4.500)

## 2017-12-24 ENCOUNTER — Other Ambulatory Visit: Payer: Self-pay | Admitting: Obstetrics & Gynecology

## 2017-12-24 MED ORDER — METRONIDAZOLE 500 MG PO TABS
500.0000 mg | ORAL_TABLET | Freq: Two times a day (BID) | ORAL | 0 refills | Status: DC
Start: 2017-12-24 — End: 2018-02-21

## 2017-12-26 ENCOUNTER — Ambulatory Visit (HOSPITAL_COMMUNITY)
Admission: RE | Admit: 2017-12-26 | Discharge: 2017-12-26 | Disposition: A | Discharge status: Home / Self Care | Payer: Medicaid Other | Source: Ambulatory Visit | Attending: Obstetrics & Gynecology | Admitting: Obstetrics & Gynecology

## 2017-12-26 DIAGNOSIS — N858 Other specified noninflammatory disorders of uterus: Secondary | ICD-10-CM | POA: Insufficient documentation

## 2017-12-26 DIAGNOSIS — N938 Other specified abnormal uterine and vaginal bleeding: Secondary | ICD-10-CM | POA: Insufficient documentation

## 2018-01-07 ENCOUNTER — Telehealth: Payer: Self-pay

## 2018-01-07 NOTE — Telephone Encounter (Signed)
Patient has BV showed up on her pap smear. Flagyl has been called in.

## 2018-01-07 NOTE — Telephone Encounter (Signed)
Patient was informed of her hpv and the need to have co-testing next year.

## 2018-01-09 ENCOUNTER — Ambulatory Visit
Admission: RE | Admit: 2018-01-09 | Discharge: 2018-01-09 | Disposition: A | Discharge status: Home / Self Care | Payer: Medicaid Other | Source: Ambulatory Visit | Attending: Obstetrics & Gynecology | Admitting: Obstetrics & Gynecology

## 2018-01-09 DIAGNOSIS — Z0000 Encounter for general adult medical examination without abnormal findings: Secondary | ICD-10-CM

## 2018-01-18 ENCOUNTER — Ambulatory Visit: Payer: Medicaid Other | Admitting: Obstetrics & Gynecology

## 2018-02-15 ENCOUNTER — Ambulatory Visit: Payer: Medicaid Other | Admitting: Obstetrics & Gynecology

## 2018-02-21 ENCOUNTER — Other Ambulatory Visit (HOSPITAL_COMMUNITY)
Admission: RE | Admit: 2018-02-21 | Discharge: 2018-02-21 | Disposition: A | Discharge status: Home / Self Care | Payer: Medicaid Other | Source: Ambulatory Visit | Attending: Obstetrics & Gynecology | Admitting: Obstetrics & Gynecology

## 2018-02-21 ENCOUNTER — Ambulatory Visit (INDEPENDENT_AMBULATORY_CARE_PROVIDER_SITE_OTHER): Payer: Medicaid Other | Admitting: Obstetrics & Gynecology

## 2018-02-21 VITALS — BP 129/78 | HR 79 | Ht 67.0 in | Wt 142.0 lb

## 2018-02-21 DIAGNOSIS — N926 Irregular menstruation, unspecified: Secondary | ICD-10-CM | POA: Insufficient documentation

## 2018-02-21 DIAGNOSIS — Z3202 Encounter for pregnancy test, result negative: Secondary | ICD-10-CM

## 2018-02-21 LAB — POCT URINE PREGNANCY: PREG TEST UR: NEGATIVE

## 2018-02-21 NOTE — Progress Notes (Signed)
   Subjective:    Patient ID: Sara Krueger, female    DOB: 03/01/1976, 42 y.o.   MRN: [id number]  HPI 42 yo single Hispanic P4 here for a EMBX due to her DUB.   Review of Systems     Objective:   Physical Exam Breathing, conversing, and ambulating normally Well nourished, well hydrated Hispanic female, no apparent distress  UPT negative, consent signed, time out done Cervix prepped with betadine and grasped with a single tooth tenaculum Uterus sounded to 9 cm Pipelle used for 2 passes with a moderate amount of tissue obtained. She tolerated the procedure well.      Assessment & Plan:  DUB- await pathology Come back 2 weeks

## 2018-02-25 ENCOUNTER — Telehealth: Payer: Self-pay

## 2018-02-25 NOTE — Telephone Encounter (Signed)
Call patient no answer inform her to called the office regarding no urgent test results.

## 2018-02-25 NOTE — Telephone Encounter (Signed)
-----   Message from Allie BossierMyra C Dove, MD sent at 02/25/2018  2:41 PM EDT ----- Please let her know that her endometrial biopsy was normal. Thanks

## 2018-03-12 ENCOUNTER — Encounter: Payer: Self-pay | Admitting: Obstetrics & Gynecology

## 2018-03-12 ENCOUNTER — Ambulatory Visit (INDEPENDENT_AMBULATORY_CARE_PROVIDER_SITE_OTHER): Payer: Medicaid Other | Admitting: Obstetrics & Gynecology

## 2018-03-12 VITALS — BP 117/65 | HR 72 | Wt 141.2 lb

## 2018-03-12 DIAGNOSIS — N926 Irregular menstruation, unspecified: Secondary | ICD-10-CM | POA: Diagnosis not present

## 2018-03-12 DIAGNOSIS — R102 Pelvic and perineal pain: Secondary | ICD-10-CM

## 2018-03-12 MED ORDER — NORGESTREL-ETHINYL ESTRADIOL 0.3-30 MG-MCG PO TABS: 1.0000 | ORAL_TABLET | Freq: Every day | ORAL | 11 refills | Status: DC

## 2018-03-12 NOTE — Progress Notes (Signed)
   Subjective:    Patient ID: Sara Krueger, female    DOB: [birth year], 42 y.o.   MRN: [id number]  HPI 42 yo P4 here for followup for pelvic pain and DUB. She has finally stopped bleeding. She tried OCPs in the past but has heart palpitations and doesn't want to use them again.  She tried the IUD for about 6 months but it caused her back pain. When it was removed, her back pain resolved. She uses condoms for contraception.  She pain is with her period, but her period are irregular, bleeds up to 3 weeks per month. Light bleeding. Her u/s and EMBX were normal.  She had a BTL about 12 years ago but then conceived 3year later. Review of Systems     Objective:   Physical Exam  Breathing, conversing, and ambulating normally Well nourished, well hydrated Latina , no apparent distress      Assessment & Plan:  Pelvic pain-plan for diagnostic laparoscopy Unwanted fertility- plan for repeat BTL DUB- she is not not interested in having an ablation She is willing to re try another OCPs

## 2018-03-18 ENCOUNTER — Encounter (HOSPITAL_COMMUNITY): Payer: Self-pay

## 2018-05-06 NOTE — Patient Instructions (Addendum)
Your procedure is scheduled on: Tuesday, July 9  Enter through the Main Entrance of [facility] at: 1:30 pm  Pick up the phone at the desk and dial [phone].  Call this number if you have problems the morning of surgery: [phone].  Remember: Do NOT eat food after midnight Monday  Do NOT drink clear liquids (including water) after 9 am Tuesday, day of surgery.  Take these medicines the morning of surgery with a SIP OF WATER:  None   Stop herbal medications, vitamin supplements and Ibuprofen/NSAIDS at this time.  Do NOT wear jewelry (body piercing), metal hair clips/bobby pins, make-up, or nail polish. Do NOT wear lotions, powders, or perfumes.  You may wear deoderant. Do NOT shave for 48 hours prior to surgery. Do NOT bring valuables to the hospital.  Have a responsible adult drive you home and stay with you for 24 hours after your procedure.  Home with [place] cell [phone]

## 2018-05-13 ENCOUNTER — Other Ambulatory Visit: Payer: Self-pay

## 2018-05-13 ENCOUNTER — Encounter (HOSPITAL_COMMUNITY)
Admission: RE | Admit: 2018-05-13 | Discharge: 2018-05-13 | Disposition: A | Discharge status: Home / Self Care | Payer: Medicaid Other | Source: Ambulatory Visit | Attending: Obstetrics & Gynecology | Admitting: Obstetrics & Gynecology

## 2018-05-13 ENCOUNTER — Encounter (HOSPITAL_COMMUNITY): Payer: Self-pay

## 2018-05-13 DIAGNOSIS — Z01812 Encounter for preprocedural laboratory examination: Secondary | ICD-10-CM | POA: Diagnosis present

## 2018-05-13 LAB — CBC
HCT: 41.9 % (ref 36.0–46.0)
HEMOGLOBIN: 15.3 g/dL — AB (ref 12.0–15.0)
MCH: 32.8 pg (ref 26.0–34.0)
MCHC: 36.5 g/dL — AB (ref 30.0–36.0)
MCV: 89.9 fL (ref 78.0–100.0)
PLATELETS: 210 10*3/uL (ref 150–400)
RBC: 4.66 MIL/uL (ref 3.87–5.11)
RDW: 13.1 % (ref 11.5–15.5)
WBC: 6.8 10*3/uL (ref 4.0–10.5)

## 2018-05-21 ENCOUNTER — Ambulatory Visit (HOSPITAL_COMMUNITY): Payer: Medicaid Other | Admitting: Anesthesiology

## 2018-05-21 ENCOUNTER — Other Ambulatory Visit: Payer: Self-pay

## 2018-05-21 ENCOUNTER — Encounter (HOSPITAL_COMMUNITY): Payer: Self-pay

## 2018-05-21 ENCOUNTER — Ambulatory Visit (HOSPITAL_COMMUNITY)
Admission: AD | Admit: 2018-05-21 | Discharge: 2018-05-21 | Disposition: A | Discharge status: Home / Self Care | Payer: Medicaid Other | Source: Ambulatory Visit | Attending: Obstetrics & Gynecology | Admitting: Obstetrics & Gynecology

## 2018-05-21 ENCOUNTER — Encounter (HOSPITAL_COMMUNITY)
Admission: AD | Disposition: A | Discharge status: Home / Self Care | Payer: Self-pay | Source: Ambulatory Visit | Attending: Obstetrics & Gynecology

## 2018-05-21 DIAGNOSIS — Z302 Encounter for sterilization: Secondary | ICD-10-CM | POA: Insufficient documentation

## 2018-05-21 DIAGNOSIS — Z79899 Other long term (current) drug therapy: Secondary | ICD-10-CM | POA: Diagnosis not present

## 2018-05-21 DIAGNOSIS — Z793 Long term (current) use of hormonal contraceptives: Secondary | ICD-10-CM | POA: Insufficient documentation

## 2018-05-21 DIAGNOSIS — G8929 Other chronic pain: Secondary | ICD-10-CM | POA: Diagnosis not present

## 2018-05-21 DIAGNOSIS — R102 Pelvic and perineal pain: Secondary | ICD-10-CM

## 2018-05-21 DIAGNOSIS — Z791 Long term (current) use of non-steroidal anti-inflammatories (NSAID): Secondary | ICD-10-CM | POA: Insufficient documentation

## 2018-05-21 DIAGNOSIS — N938 Other specified abnormal uterine and vaginal bleeding: Secondary | ICD-10-CM | POA: Diagnosis not present

## 2018-05-21 HISTORY — PX: LAPAROSCOPIC TUBAL LIGATION: SHX1937

## 2018-05-21 HISTORY — PX: LAPAROSCOPY: SHX197

## 2018-05-21 LAB — PREGNANCY, URINE: Preg Test, Ur: NEGATIVE

## 2018-05-21 SURGERY — LAPAROSCOPY, DIAGNOSTIC
Anesthesia: General | Site: Abdomen

## 2018-05-21 MED ORDER — ONDANSETRON HCL 4 MG/2ML IJ SOLN
INTRAMUSCULAR | Status: AC
  Filled 2018-05-21: qty 2

## 2018-05-21 MED ORDER — MIDAZOLAM HCL 5 MG/5ML IJ SOLN
INTRAMUSCULAR | Status: DC | PRN
  Administered 2018-05-21: 2 mg via INTRAVENOUS

## 2018-05-21 MED ORDER — OXYCODONE HCL 5 MG/5ML PO SOLN: 5.0000 mg | Freq: Once | ORAL | Status: DC | PRN

## 2018-05-21 MED ORDER — OXYCODONE-ACETAMINOPHEN 5-325 MG PO TABS
1.0000 | ORAL_TABLET | ORAL | Status: DC | PRN
  Administered 2018-05-21: 1 via ORAL

## 2018-05-21 MED ORDER — OXYCODONE-ACETAMINOPHEN 5-325 MG PO TABS: 1.0000 | ORAL_TABLET | ORAL | 0 refills | Status: DC | PRN

## 2018-05-21 MED ORDER — LACTATED RINGERS IV SOLN
INTRAVENOUS | Status: DC
  Administered 2018-05-21: 125 mL/h via INTRAVENOUS

## 2018-05-21 MED ORDER — LIDOCAINE 2% (20 MG/ML) 5 ML SYRINGE
INTRAMUSCULAR | Status: DC | PRN
  Administered 2018-05-21: 100 mg via INTRAVENOUS

## 2018-05-21 MED ORDER — DEXAMETHASONE SODIUM PHOSPHATE 10 MG/ML IJ SOLN
INTRAMUSCULAR | Status: DC | PRN
  Administered 2018-05-21: 10 mg via INTRAVENOUS

## 2018-05-21 MED ORDER — BUPIVACAINE HCL (PF) 0.5 % IJ SOLN
INTRAMUSCULAR | Status: DC | PRN
  Administered 2018-05-21: 10 mL

## 2018-05-21 MED ORDER — LIDOCAINE HCL (CARDIAC) PF 100 MG/5ML IV SOSY
PREFILLED_SYRINGE | INTRAVENOUS | Status: AC
  Filled 2018-05-21: qty 5

## 2018-05-21 MED ORDER — ROCURONIUM BROMIDE 10 MG/ML (PF) SYRINGE
PREFILLED_SYRINGE | INTRAVENOUS | Status: DC | PRN
  Administered 2018-05-21: 50 mg via INTRAVENOUS

## 2018-05-21 MED ORDER — DEXAMETHASONE SODIUM PHOSPHATE 10 MG/ML IJ SOLN
INTRAMUSCULAR | Status: AC
Start: 2018-05-21 — End: ?
  Filled 2018-05-21: qty 1

## 2018-05-21 MED ORDER — KETOROLAC TROMETHAMINE 30 MG/ML IJ SOLN
INTRAMUSCULAR | Status: AC
  Filled 2018-05-21: qty 8

## 2018-05-21 MED ORDER — OXYCODONE HCL 5 MG PO TABS: 5.0000 mg | ORAL_TABLET | Freq: Once | ORAL | Status: DC | PRN

## 2018-05-21 MED ORDER — MIDAZOLAM HCL 2 MG/2ML IJ SOLN
INTRAMUSCULAR | Status: AC
Start: 2018-05-21 — End: ?
  Filled 2018-05-21: qty 2

## 2018-05-21 MED ORDER — SCOPOLAMINE 1 MG/3DAYS TD PT72
MEDICATED_PATCH | TRANSDERMAL | Status: AC
  Administered 2018-05-21: 1.5 mg via TRANSDERMAL
  Filled 2018-05-21: qty 1

## 2018-05-21 MED ORDER — OXYCODONE-ACETAMINOPHEN 5-325 MG PO TABS
1.0000 | ORAL_TABLET | ORAL | 0 refills | Status: DC | PRN
Start: 2018-05-21 — End: 2018-07-02

## 2018-05-21 MED ORDER — FENTANYL CITRATE (PF) 100 MCG/2ML IJ SOLN
INTRAMUSCULAR | Status: AC
  Filled 2018-05-21: qty 2

## 2018-05-21 MED ORDER — PROPOFOL 10 MG/ML IV BOLUS
INTRAVENOUS | Status: AC
  Filled 2018-05-21: qty 20

## 2018-05-21 MED ORDER — FENTANYL CITRATE (PF) 100 MCG/2ML IJ SOLN
25.0000 ug | INTRAMUSCULAR | Status: DC | PRN
  Administered 2018-05-21 (×3): 50 ug via INTRAVENOUS

## 2018-05-21 MED ORDER — KETOROLAC TROMETHAMINE 30 MG/ML IJ SOLN
INTRAMUSCULAR | Status: DC | PRN
  Administered 2018-05-21: 30 mg via INTRAVENOUS

## 2018-05-21 MED ORDER — SCOPOLAMINE 1 MG/3DAYS TD PT72
1.0000 | MEDICATED_PATCH | Freq: Once | TRANSDERMAL | Status: DC
  Administered 2018-05-21: 1.5 mg via TRANSDERMAL

## 2018-05-21 MED ORDER — HYDROMORPHONE HCL 1 MG/ML IJ SOLN
0.5000 mg | INTRAMUSCULAR | Status: DC | PRN
  Administered 2018-05-21: 0.5 mg via INTRAVENOUS

## 2018-05-21 MED ORDER — FENTANYL CITRATE (PF) 250 MCG/5ML IJ SOLN
INTRAMUSCULAR | Status: DC | PRN
  Administered 2018-05-21 (×2): 100 ug via INTRAVENOUS
  Administered 2018-05-21: 50 ug via INTRAVENOUS

## 2018-05-21 MED ORDER — IBUPROFEN 600 MG PO TABS: 600.0000 mg | ORAL_TABLET | Freq: Four times a day (QID) | ORAL | 1 refills | Status: DC | PRN

## 2018-05-21 MED ORDER — MIDAZOLAM HCL 2 MG/2ML IJ SOLN
INTRAMUSCULAR | Status: AC
  Filled 2018-05-21: qty 2

## 2018-05-21 MED ORDER — PROPOFOL 10 MG/ML IV BOLUS
INTRAVENOUS | Status: DC | PRN
  Administered 2018-05-21: 180 mg via INTRAVENOUS
  Administered 2018-05-21: 20 mg via INTRAVENOUS

## 2018-05-21 MED ORDER — BUPIVACAINE HCL (PF) 0.5 % IJ SOLN
INTRAMUSCULAR | Status: AC
  Filled 2018-05-21: qty 30

## 2018-05-21 MED ORDER — SUGAMMADEX SODIUM 200 MG/2ML IV SOLN
INTRAVENOUS | Status: DC | PRN
  Administered 2018-05-21: 200 mg via INTRAVENOUS

## 2018-05-21 MED ORDER — PROMETHAZINE HCL 25 MG/ML IJ SOLN: 6.2500 mg | INTRAMUSCULAR | Status: DC | PRN

## 2018-05-21 MED ORDER — SUGAMMADEX SODIUM 200 MG/2ML IV SOLN
INTRAVENOUS | Status: AC
  Filled 2018-05-21: qty 2

## 2018-05-21 MED ORDER — OXYCODONE-ACETAMINOPHEN 5-325 MG PO TABS
ORAL_TABLET | ORAL | Status: AC
  Filled 2018-05-21: qty 1

## 2018-05-21 MED ORDER — DEXAMETHASONE SODIUM PHOSPHATE 10 MG/ML IJ SOLN
INTRAMUSCULAR | Status: AC
  Filled 2018-05-21: qty 1

## 2018-05-21 MED ORDER — FENTANYL CITRATE (PF) 250 MCG/5ML IJ SOLN
INTRAMUSCULAR | Status: AC
Start: 2018-05-21 — End: ?
  Filled 2018-05-21: qty 5

## 2018-05-21 MED ORDER — ONDANSETRON HCL 4 MG/2ML IJ SOLN
INTRAMUSCULAR | Status: DC | PRN
  Administered 2018-05-21: 4 mg via INTRAVENOUS

## 2018-05-21 MED ORDER — HYDROMORPHONE HCL 1 MG/ML IJ SOLN
INTRAMUSCULAR | Status: AC
  Filled 2018-05-21: qty 1

## 2018-05-21 MED ORDER — FENTANYL CITRATE (PF) 250 MCG/5ML IJ SOLN
INTRAMUSCULAR | Status: AC
  Filled 2018-05-21: qty 5

## 2018-05-21 MED ORDER — ROCURONIUM BROMIDE 100 MG/10ML IV SOLN
INTRAVENOUS | Status: AC
  Filled 2018-05-21: qty 1

## 2018-05-21 SURGICAL SUPPLY — 33 items
BAG SPEC RTRVL LRG 6X4 10 (ENDOMECHANICALS)
CABLE HIGH FREQUENCY MONO STRZ (ELECTRODE) IMPLANT
CLOSURE WOUND 1/2 X4 (GAUZE/BANDAGES/DRESSINGS) ×1
DRSG OPSITE POSTOP 3X4 (GAUZE/BANDAGES/DRESSINGS) IMPLANT
DURAPREP 26ML APPLICATOR (WOUND CARE) ×4 IMPLANT
ELECT REM PT RETURN 9FT ADLT (ELECTROSURGICAL)
ELECTRODE REM PT RTRN 9FT ADLT (ELECTROSURGICAL) IMPLANT
GLOVE BIO SURGEON STRL SZ 6.5 (GLOVE) ×6 IMPLANT
GLOVE BIO SURGEONS STRL SZ 6.5 (GLOVE) ×2
GLOVE BIOGEL PI IND STRL 7.0 (GLOVE) ×4 IMPLANT
GLOVE BIOGEL PI INDICATOR 7.0 (GLOVE) ×4
GOWN STRL REUS W/TWL LRG LVL3 (GOWN DISPOSABLE) ×8 IMPLANT
NDL SAFETY ECLIPSE 18X1.5 (NEEDLE) ×2 IMPLANT
NEEDLE HYPO 18GX1.5 SHARP (NEEDLE) ×4
NEEDLE INSUFFLATION 120MM (ENDOMECHANICALS) ×4 IMPLANT
NS IRRIG 1000ML POUR BTL (IV SOLUTION) ×4 IMPLANT
PACK LAPAROSCOPY BASIN (CUSTOM PROCEDURE TRAY) ×4 IMPLANT
PACK TRENDGUARD 450 HYBRID PRO (MISCELLANEOUS) IMPLANT
POUCH SPECIMEN RETRIEVAL 10MM (ENDOMECHANICALS) IMPLANT
PROTECTOR NERVE ULNAR (MISCELLANEOUS) ×8 IMPLANT
SET IRRIG TUBING LAPAROSCOPIC (IRRIGATION / IRRIGATOR) IMPLANT
SHEARS HARMONIC ACE PLUS 36CM (ENDOMECHANICALS) IMPLANT
SLEEVE XCEL OPT CAN 5 100 (ENDOMECHANICALS) IMPLANT
STRIP CLOSURE SKIN 1/2X4 (GAUZE/BANDAGES/DRESSINGS) ×3 IMPLANT
SUT VICRYL 0 UR6 27IN ABS (SUTURE) ×4 IMPLANT
SUT VICRYL 4-0 PS2 18IN ABS (SUTURE) ×4 IMPLANT
SYSTEM CARTER THOMASON II (TROCAR) IMPLANT
TOWEL OR 17X24 6PK STRL BLUE (TOWEL DISPOSABLE) ×8 IMPLANT
TRENDGUARD 450 HYBRID PRO PACK (MISCELLANEOUS)
TROCAR OPTI TIP 5M 100M (ENDOMECHANICALS) ×4 IMPLANT
TROCAR XCEL DIL TIP R 11M (ENDOMECHANICALS) ×4 IMPLANT
TUBING INSUF HEATED (TUBING) ×4 IMPLANT
WARMER LAPAROSCOPE (MISCELLANEOUS) ×4 IMPLANT

## 2018-05-21 NOTE — OR Nursing (Signed)
Dr. Marice Potterove placed Filshie Clips on both left and right fallopian tubes.   Ref # [id number] Lot Number [id number] Expiration 04/12/2020

## 2018-05-21 NOTE — Transfer of Care (Signed)
Immediate Anesthesia Transfer of Care Note  Patient: Sara Krueger  Procedure(s) Performed: LAPAROSCOPY DIAGNOSTIC (N/A Abdomen) LAPAROSCOPIC TUBAL LIGATION (Bilateral Abdomen)  Patient Location: PACU  Anesthesia Type:General  Level of Consciousness: sedated  Airway & Oxygen Therapy: Patient Spontanous Breathing and Patient connected to nasal cannula oxygen  Post-op Assessment: Report given to RN and Post -op Vital signs reviewed and stable  Post vital signs: Reviewed and stable  Last Vitals:  Vitals Value Taken Time  BP    Temp    Pulse    Resp    SpO2      Last Pain:  Vitals:   05/21/18 0956  TempSrc: Oral  PainSc: 1       Patients Stated Pain Goal: 3 (05/21/18 0956)  Complications: No apparent anesthesia complications

## 2018-05-21 NOTE — Anesthesia Procedure Notes (Signed)
Procedure Name: Intubation Date/Time: 05/21/2018 12:39 PM Performed by: Talbot Grumbling, CRNA Pre-anesthesia Checklist: Patient identified, Emergency Drugs available, Suction available and Patient being monitored Patient Re-evaluated:Patient Re-evaluated prior to induction Oxygen Delivery Method: Circle system utilized Preoxygenation: Pre-oxygenation with 100% oxygen Induction Type: IV induction Ventilation: Mask ventilation without difficulty Laryngoscope Size: Mac and 3 Grade View: Grade I Tube type: Oral Tube size: 7.0 mm Number of attempts: 1 Airway Equipment and Method: Stylet Placement Confirmation: ETT inserted through vocal cords under direct vision,  positive ETCO2 and breath sounds checked- equal and bilateral Secured at: 22 cm Tube secured with: Tape Dental Injury: Teeth and Oropharynx as per pre-operative assessment

## 2018-05-21 NOTE — Anesthesia Postprocedure Evaluation (Signed)
Anesthesia Post Note  Patient: Concha A Veras  Procedure(s) Performed: LAPAROSCOPY DIAGNOSTIC (N/A Abdomen) LAPAROSCOPIC TUBAL LIGATION (Bilateral Abdomen)     Patient location during evaluation: PACU Anesthesia Type: General Level of consciousness: awake and alert Pain management: pain level controlled Vital Signs Assessment: post-procedure vital signs reviewed and stable Respiratory status: spontaneous breathing, nonlabored ventilation and respiratory function stable Cardiovascular status: blood pressure returned to baseline and stable Postop Assessment: no apparent nausea or vomiting Anesthetic complications: no    Last Vitals:  Vitals:   05/21/18 1400 05/21/18 1415  BP: 112/71 115/74  Pulse: 82 81  Resp: 14 15  Temp:    SpO2: 100% 100%    Last Pain:  Vitals:   05/21/18 1400  TempSrc:   PainSc: Asleep   Pain Goal: Patients Stated Pain Goal: 3 (05/21/18 1415)               Beryle Lathehomas E Amberlee Garvey

## 2018-05-21 NOTE — Progress Notes (Signed)
PATIENT UP TO PHASE 11 STATES FEELS  DIZZY WITH CHEST DISCOMFORT. DR. Mal AmabileBROCK CALLED. WILL SEE IN PHASE 11

## 2018-05-21 NOTE — Anesthesia Preprocedure Evaluation (Addendum)
Anesthesia Evaluation  Patient identified by MRN, date of birth, ID band Patient awake    Reviewed: Allergy & Precautions, NPO status , Patient's Chart, lab work & pertinent test results  History of Anesthesia Complications Negative for: history of anesthetic complications  Airway Mallampati: I  TM Distance: >3 FB Neck ROM: Full    Dental  (+) Dental Advisory Given, Teeth Intact   Pulmonary neg pulmonary ROS,    breath sounds clear to auscultation       Cardiovascular negative cardio ROS   Rhythm:Regular Rate:Normal     Neuro/Psych Depression negative neurological ROS     GI/Hepatic negative GI ROS, Neg liver ROS,   Endo/Other  negative endocrine ROS  Renal/GU negative Renal ROS  negative genitourinary   Musculoskeletal negative musculoskeletal ROS (+)   Abdominal   Peds  Hematology negative hematology ROS (+)   Anesthesia Other Findings   Reproductive/Obstetrics                           Anesthesia Physical Anesthesia Plan  ASA: II  Anesthesia Plan: General   Post-op Pain Management:    Induction: Intravenous  PONV Risk Score and Plan: 4 or greater and Treatment may vary due to age or medical condition, Ondansetron, Scopolamine patch - Pre-op, Dexamethasone and Midazolam  Airway Management Planned: Oral ETT  Additional Equipment: None  Intra-op Plan:   Post-operative Plan: Extubation in OR  Informed Consent: I have reviewed the patients History and Physical, chart, labs and discussed the procedure including the risks, benefits and alternatives for the proposed anesthesia with the patient or authorized representative who has indicated his/her understanding and acceptance.   Dental advisory given  Plan Discussed with: CRNA and Anesthesiologist  Anesthesia Plan Comments:         Anesthesia Quick Evaluation

## 2018-05-21 NOTE — H&P (Addendum)
Sara Krueger is an 42 y.o. P4 here for followup for pelvic pain and DUB. She has finally stopped bleeding. She tried OCPs in the past but has heart palpitations and doesn't want to use them again.  She tried the IUD for about 6 months but it caused her back pain. When it was removed, her back pain resolved. She uses condoms for contraception.  She pain is with her period, but her period are irregular, bleeds up to 3 weeks per month. Light bleeding. Her u/s and EMBX were normal.  She had a BTL about 12 years ago but then conceived 3year later.   Patient's last menstrual period was 05/19/2018 (exact date).    Past Medical History:  Diagnosis Date  . Depression    no meds  . SVD (spontaneous vaginal delivery)    x 4    Past Surgical History:  Procedure Laterality Date  . TUBAL LIGATION  2006   Failed Tubal    Family History  Problem Relation Age of Onset  . Breast cancer Paternal Aunt [id number]    Social History:  reports that she has never smoked. She has never used smokeless tobacco. She reports that she does not drink alcohol or use drugs.  Allergies: No Known Allergies  Medications Prior to Admission  Medication Sig Dispense Refill Last Dose  . Cyanocobalamin (VITAMIN B-12 PO) Take 1 tablet by mouth daily.   Past Week at Unknown time  . ibuprofen (ADVIL,MOTRIN) 600 MG tablet Take 1 tablet (600 mg total) by mouth every 8 (eight) hours as needed. (Patient taking differently: Take 600 mg by mouth every 6 (six) hours as needed (for pain.). ) 60 tablet 1 Past Month at Unknown time  . norgestrel-ethinyl estradiol (LO/OVRAL,CRYSELLE) 0.3-30 MG-MCG tablet Take 1 tablet by mouth daily. (Patient taking differently: Take 1 tablet by mouth at bedtime. ) 1 Package 11 Past Month at Unknown time    ROS  Blood pressure (!) 133/93, pulse 100, temperature 98.4 F (36.9 C), temperature source Oral, resp. rate 16, last menstrual period 05/19/2018, SpO2 100 %. Physical Exam Heart- rrr Lungs-  CTAB Abd- benign  Results for orders placed or performed during the hospital encounter of 05/21/18 (from the past 24 hour(s))  Pregnancy, urine     Status: None   Collection Time: 05/21/18 10:00 AM  Result Value Ref Range   Preg Test, Ur NEGATIVE NEGATIVE    No results found.  Assessment/Plan: Unwanted fertility- I will plan to place Filsche clips on her oviducts Chronic pelvic pain- I will do a laparoscopy at the time of her tubal ligation DUB- she declines surgery for this.  She understands the risks of surgery, including, but not to infection, bleeding, DVTs, damage to bowel, bladder, ureters. She wishes to proceed.     Allie BossierMyra C Edwin Baines 05/21/2018, 12:27 PM

## 2018-05-21 NOTE — Discharge Instructions (Addendum)
Laparoscopic Tubal Ligation, Care After °Refer to this sheet in the next few weeks. These instructions provide you with information about caring for yourself after your procedure. Your health care provider may also give you more specific instructions. Your treatment has been planned according to current medical practices, but problems sometimes occur. Call your health care provider if you have any problems or questions after your procedure. °What can I expect after the procedure? °After the procedure, it is common to have: °· A sore throat. °· Discomfort in your shoulder. °· Mild discomfort or cramping in your abdomen. °· Gas pains. °· Pain or soreness in the area where the surgical cut (incision) was made. °· A bloated feeling. °· Tiredness. °· Nausea. °· Vomiting. ° °Follow these instructions at home: °Medicines °· Take over-the-counter and prescription medicines only as told by your health care provider. °· Do not take aspirin because it can cause bleeding. °· Do not drive or operate heavy machinery while taking prescription pain medicine. °Activity °· Rest for the rest of the day. °· Return to your normal activities as told by your health care provider. Ask your health care provider what activities are safe for you. °Incision care ° °· Follow instructions from your health care provider about how to take care of your incision. Make sure you: °? Wash your hands with soap and water before you change your bandage (dressing). If soap and water are not available, use hand sanitizer. °? Change your dressing as told by your health care provider. °? Leave stitches (sutures) in place. They may need to stay in place for 2 weeks or longer. °· Check your incision area every day for signs of infection. Check for: °? More redness, swelling, or pain. °? More fluid or blood. °? Warmth. °? Pus or a bad smell. °Other Instructions °· Do not take baths, swim, or use a hot tub until your health care provider approves. You may take  showers. °· Keep all follow-up visits as told by your health care provider. This is important. °· Have someone help you with your daily household tasks for the first few days. °Contact a health care provider if: °· You have more redness, swelling, or pain around your incision. °· Your incision feels warm to the touch. °· You have pus or a bad smell coming from your incision. °· The edges of your incision break open after the sutures have been removed. °· Your pain does not improve after 2-3 days. °· You have a rash. °· You repeatedly become dizzy or light-headed. °· Your pain medicine is not helping. °· You are constipated. °Get help right away if: °· You have a fever. °· You faint. °· You have increasing pain in your abdomen. °· You have severe pain in one or both of your shoulders. °· You have fluid or blood coming from your sutures or from your vagina. °· You have shortness of breath or difficulty breathing. °· You have chest pain or leg pain. °· You have ongoing nausea, vomiting, or diarrhea. °This information is not intended to replace advice given to you by your health care provider. Make sure you discuss any questions you have with your health care provider. °Document Released: 05/19/2005 Document Revised: 04/03/2016 Document Reviewed: 10/10/2015 °Elsevier Interactive Patient Education © 2018 [employer] ° °Post Anesthesia Home Care Instructions ° °NO IBUPROFEN PRODUCTS UNTIL: 7:00 PM TONIGHT ° °Activity: °Get plenty of rest for the remainder of the day. A responsible individual must stay with you for 24   hours following the procedure.  °For the next 24 hours, DO NOT: °-Drive a car °-Operate machinery °-Drink alcoholic beverages °-Take any medication unless instructed by your physician °-Make any legal decisions or sign important papers. ° °Meals: °Start with liquid foods such as gelatin or soup. Progress to regular foods as tolerated. Avoid greasy, spicy, heavy foods. If nausea and/or vomiting occur, drink  only clear liquids until the nausea and/or vomiting subsides. Call your physician if vomiting continues. ° °Special Instructions/Symptoms: °Your throat may feel dry or sore from the anesthesia or the breathing tube placed in your throat during surgery. If this causes discomfort, gargle with warm salt water. The discomfort should disappear within 24 hours. ° °If you had a scopolamine patch placed behind your ear for the management of post- operative nausea and/or vomiting: ° °1. The medication in the patch is effective for 72 hours, after which it should be removed.  Wrap patch in a tissue and discard in the trash. Wash hands thoroughly with soap and water. °2. You may remove the patch earlier than 72 hours if you experience unpleasant side effects which may include dry mouth, dizziness or visual disturbances. °3. Avoid touching the patch. Wash your hands with soap and water after contact with the patch. °  ° °

## 2018-05-21 NOTE — Op Note (Signed)
05/21/2018  1:13 PM  PATIENT:  Sara Krueger  42 y.o. female  PRE-OPERATIVE DIAGNOSIS:  PELVIC PAIN UNDESIRED FERTILITY  POST-OPERATIVE DIAGNOSIS:  PELVIC PAIN UNDESIRED FERTILITY  PROCEDURE:  Procedure(s) with comments: LAPAROSCOPY DIAGNOSTIC (N/A) - removal of two filshe clips in abdomen from a previous procedure  LAPAROSCOPIC TUBAL LIGATION (Bilateral) - with filshie Clips   SURGEON:  Surgeon(s) and Role:    * Valora Norell C, MD - Primary  ANESTHESIA:   local and general  EBL:  10 mL   BLOOD ADMINISTERED:none  DRAINS: none   LOCAL MEDICATIONS USED:  MARCAINE     SPECIMEN:  No Specimen  DISPOSITION OF SPECIMEN:  PATHOLOGY  COUNTS:  YES  TOURNIQUET:  * No tourniquets in log *  DICTATION: .Dragon Dictation  PLAN OF CARE: Discharge to home after PACU  PATIENT DISPOSITION:  PACU - hemodynamically stable.   Delay start of Pharmacological VTE agent (>24hrs) due to surgical blood loss or risk of bleeding: not applicable   The risks, benefits, alternatives of surgery were explained understood, accepted. All questions were answered. She understands the failure rate of 1/400. She declines alternative forms of birth control. Her urine pregnancy test was negative. She was taken to the operating room and general anesthesia was applied without complication. She was placed in dorsal lithotomy position. Her abdomen and vagina were prepped and draped in the usual sterile fashion. A time out procedure was done. A bimanual exam revealed a normal, size and shape mobile uterus with nonenlarged adnexa.Her uterus felt soft like a uterus with adenomyosis.  A Hulka manipulator was placed on the cervix. Gloves were changed and attention was turned to the abdomen. Approximately 10 mL of 0.5% Marcaine was used to infiltrate the subcutaneous tissue at the umbilicus. A vertical 1 cm incision was made. A Veres needle was placed in the pelvis. Low-flow CO2 was used to insufflate the abdomen to  approximately 3 L. After good pneumoperitoneum was established, a 11 mm trocar was placed. Laparoscopy confirmed correct placement. She was placed in the Trendelenburg position. Patient abdominal pressure was always less than 15. Her uterus ovaries and tubes appeared normal. There was no evidence of endometriosis. The oviducts both had evidence that there had been Filsche clips on them in the past. They were transected. Both of the Filsche clips were lightly attached to the right mesosalpinx. In the isthmic region of each oviduct, a Filshie clip was placed across the entire oviduct. There was no bleeding. I removed the old Filsche clips. The upper abdomen and ovarian fossae on each side were normal, no visible etiology for her pelvic pain. The CO2 was allowed to escape from her abdomen. The umbilical fascia was closed with a figure of 0 Vicryl suture. No defects were palpable. The subcuticular closure was done with 4-0 Vicryl suture. The Hulka manipulator was removed. She was extubated and taken to recovery in stable condition. She tolerated the procedure well. The instrument, sponge, and needle counts were correct.

## 2018-05-22 ENCOUNTER — Encounter (HOSPITAL_COMMUNITY): Payer: Self-pay | Admitting: Obstetrics & Gynecology

## 2018-05-27 ENCOUNTER — Encounter (HOSPITAL_COMMUNITY): Payer: Self-pay

## 2018-05-28 ENCOUNTER — Ambulatory Visit (INDEPENDENT_AMBULATORY_CARE_PROVIDER_SITE_OTHER): Payer: Medicaid Other | Admitting: Obstetrics and Gynecology

## 2018-05-28 ENCOUNTER — Encounter: Payer: Self-pay | Admitting: Obstetrics and Gynecology

## 2018-05-28 VITALS — BP 112/76 | HR 80 | Resp 16 | Ht 67.0 in | Wt 142.4 lb

## 2018-05-28 DIAGNOSIS — R5383 Other fatigue: Secondary | ICD-10-CM

## 2018-05-28 DIAGNOSIS — Z9889 Other specified postprocedural states: Secondary | ICD-10-CM

## 2018-05-28 NOTE — Progress Notes (Signed)
Obstetrics and Gynecology Visit Return Patient Evaluation  Appointment Date: 05/28/2018  Primary Care Provider: Medicine, [facility]  OBGYN Clinic: [facility]  Chief Complaint: left leg itching, lethargy, visual changes, ?sob  History of Present Illness:  Sara Krueger is a 42 y.o. s/p diagnostic laparoscopy for pelvic pain and BTL on 05/21/18 with Dr. Marice Potterove. Prior filshie clips removed and repeat set placed; it doesn't appear that any heat/energy was used during the procedure. Patient states that ever since the procedure she's felt tired, weak, no energy and has occasional visual changes. Also with what sounds like occasional ?sob vs not able to catch breath but no chest pain. She states she has some left leg inner part itching after surgery but none since and has occasional dizziness and what sounds like visual changes since surgery.  No hemoptysis or prior h/o similar s/s.  Review of Systems: as noted in the History of Present Illness.  Medications:  Sara Krueger had no medications administered during this visit. Current Outpatient Medications  Medication Sig Dispense Refill  . Cyanocobalamin (VITAMIN B-12 PO) Take 1 tablet by mouth daily.    Marland Kitchen. ibuprofen (ADVIL,MOTRIN) 600 MG tablet Take 1 tablet (600 mg total) by mouth every 6 (six) hours as needed. 30 tablet 1   No current facility-administered medications for this visit.     Allergies: has No Known Allergies.  Physical Exam:  BP 112/76 (BP Location: Left Arm, Patient Position: Sitting, Cuff Size: Large)   Pulse 80   Resp 16   Ht 5\' 7"  (1.702 m)   Wt 142 lb 6.4 oz (64.6 kg)   LMP 05/19/2018 (Exact Date)   BMI 22.30 kg/m  Body mass index is 22.3 kg/m. General appearance: Well nourished, well developed female in no acute distress. CV: no MRGs, normal s1 and s2 Pulm: CTAB, no resp distress  Abdomen: diffusely non tender to palpation, non distended, and no masses, hernias. Well healed  l/s port site with some old bruising at the umbilicus. C/d/i and no e/o infection.  Neuro/Psych:  Normal mood and affect. Gait normal Ext: normal b/l LE, no edema, nttp, normal skin.   Pelvic exam:  deferred   Assessment: pt stable  Plan:  1. Lethargy I told her that I don't have an explanation for her above s/s, but I don't feel it's related to her surgery.  I recommend f/u next week and if her s/s go away then she can cancel the appt. Will get a TSH, CBC, ECG.   RTC: 1wk  Cornelia Copaharlie Ronnita Paz, Jr MD Attending [facility](Faculty Practice)

## 2018-05-29 LAB — TSH: TSH: 1.35 u[IU]/mL (ref 0.450–4.500)

## 2018-05-29 LAB — CBC
HEMATOCRIT: 41.3 % (ref 34.0–46.6)
Hemoglobin: 14.5 g/dL (ref 11.1–15.9)
MCH: 32.2 pg (ref 26.6–33.0)
MCHC: 35.1 g/dL (ref 31.5–35.7)
MCV: 92 fL (ref 79–97)
Platelets: 192 10*3/uL (ref 150–450)
RBC: 4.5 x10E6/uL (ref 3.77–5.28)
RDW: 13.5 % (ref 12.3–15.4)
WBC: 7.5 10*3/uL (ref 3.4–10.8)

## 2018-06-07 ENCOUNTER — Ambulatory Visit: Payer: Medicaid Other | Admitting: Obstetrics & Gynecology

## 2018-07-02 ENCOUNTER — Ambulatory Visit (INDEPENDENT_AMBULATORY_CARE_PROVIDER_SITE_OTHER): Payer: Medicaid Other | Admitting: Obstetrics & Gynecology

## 2018-07-02 ENCOUNTER — Encounter: Payer: Self-pay | Admitting: Obstetrics & Gynecology

## 2018-07-02 VITALS — BP 122/80 | HR 75 | Ht 67.0 in | Wt 142.4 lb

## 2018-07-02 DIAGNOSIS — Z23 Encounter for immunization: Secondary | ICD-10-CM

## 2018-07-02 DIAGNOSIS — Z9889 Other specified postprocedural states: Secondary | ICD-10-CM

## 2018-07-02 NOTE — Progress Notes (Signed)
   Subjective:    Patient ID: Sara Krueger, female    DOB: 07/07/1976, 42 y.o.   MRN: [id number]  HPI 42 yo single P4 here for a post op visit. She had a diagnostic laparoscopy for her pelvic pain as well as removal of displaced Filsche clips and reapplicaton of Filsche clips. She reports that the pre op pain is no longer present. She does have some discomfort in her left groin which is relieved with IBU. She has had a period since her procedure and has not had sex since (She doesn't have a partner currently).   Review of Systems     Objective:   Physical Exam  Breathing, conversing, and ambulating normally Well nourished, well hydrated Latina, no apparent distress  Incision- healed well     Assessment & Plan:  Preventative care- TDAP today She declines flu vaccine

## 2018-07-02 NOTE — Progress Notes (Signed)
Patient reports on/off LLQ pain since the tubal ligation, otherwise she's doing fine.

## 2018-08-30 DIAGNOSIS — G253 Myoclonus: Secondary | ICD-10-CM | POA: Insufficient documentation

## 2018-08-30 HISTORY — DX: Myoclonus: G25.3

## 2018-09-02 ENCOUNTER — Other Ambulatory Visit: Payer: Self-pay | Admitting: Physician Assistant

## 2018-09-02 DIAGNOSIS — G253 Myoclonus: Secondary | ICD-10-CM

## 2018-09-13 ENCOUNTER — Ambulatory Visit
Admission: RE | Admit: 2018-09-13 | Discharge: 2018-09-13 | Disposition: A | Discharge status: Home / Self Care | Payer: Medicaid Other | Source: Ambulatory Visit | Attending: Physician Assistant | Admitting: Physician Assistant

## 2018-09-13 DIAGNOSIS — G253 Myoclonus: Secondary | ICD-10-CM

## 2018-09-13 MED ORDER — GADOBENATE DIMEGLUMINE 529 MG/ML IV SOLN
12.0000 mL | Freq: Once | INTRAVENOUS | Status: AC | PRN
  Administered 2018-09-13: 12 mL via INTRAVENOUS

## 2018-10-07 DIAGNOSIS — K219 Gastro-esophageal reflux disease without esophagitis: Secondary | ICD-10-CM | POA: Insufficient documentation

## 2018-10-07 HISTORY — DX: Gastro-esophageal reflux disease without esophagitis: K21.9

## 2019-05-19 ENCOUNTER — Ambulatory Visit (INDEPENDENT_AMBULATORY_CARE_PROVIDER_SITE_OTHER): Payer: Medicaid Other | Admitting: *Deleted

## 2019-05-19 ENCOUNTER — Other Ambulatory Visit: Payer: Self-pay

## 2019-05-19 ENCOUNTER — Other Ambulatory Visit (HOSPITAL_COMMUNITY)
Admission: RE | Admit: 2019-05-19 | Discharge: 2019-05-19 | Disposition: A | Discharge status: Home / Self Care | Payer: Medicaid Other | Source: Ambulatory Visit | Attending: Obstetrics and Gynecology | Admitting: Obstetrics and Gynecology

## 2019-05-19 DIAGNOSIS — N898 Other specified noninflammatory disorders of vagina: Secondary | ICD-10-CM

## 2019-05-19 NOTE — Progress Notes (Signed)
Patient seen and assessed by nursing staff during this encounter. I have reviewed the chart and agree with the documentation and plan.  Aariya Ferrick, MD 05/19/2019 11:57 AM    

## 2019-05-19 NOTE — Progress Notes (Signed)
SUBJECTIVE:  43 y.o. female complains of an increase in vaginal discharge, itching and irritation for 3 week(s).  Denies abnormal vaginal bleeding or significant pelvic pain or fever. No UTI symptoms. Denies history of known exposure to STD.  No LMP recorded.  OBJECTIVE:  She appears well, afebrile.   ASSESSMENT:  Vaginal Discharge     PLAN:  BVAG, CVAG probe sent to lab. Treatment: To be determined once lab results are received ROV prn if symptoms persist or worsen.

## 2019-05-20 ENCOUNTER — Telehealth: Payer: Self-pay | Admitting: *Deleted

## 2019-05-20 ENCOUNTER — Other Ambulatory Visit: Payer: Self-pay | Admitting: Obstetrics & Gynecology

## 2019-05-20 DIAGNOSIS — B9689 Other specified bacterial agents as the cause of diseases classified elsewhere: Secondary | ICD-10-CM

## 2019-05-20 LAB — CERVICOVAGINAL ANCILLARY ONLY
Bacterial vaginitis: POSITIVE — AB
Candida vaginitis: NEGATIVE

## 2019-05-20 MED ORDER — METRONIDAZOLE 500 MG PO TABS: 500.0000 mg | ORAL_TABLET | Freq: Two times a day (BID) | ORAL | 0 refills | Status: DC

## 2019-05-20 NOTE — Telephone Encounter (Signed)
Pt informed of results.

## 2019-05-20 NOTE — Telephone Encounter (Signed)
-----   Message from Osborne Oman, MD sent at 05/20/2019  4:02 PM EDT ----- Vaginal discharge test is abnormal and showed bacterial vaginitis. Metronidazole was prescribed. Please inform patient of results and advise to pick up prescription.

## 2019-07-25 ENCOUNTER — Other Ambulatory Visit: Payer: Self-pay | Admitting: *Deleted

## 2019-07-25 DIAGNOSIS — B9689 Other specified bacterial agents as the cause of diseases classified elsewhere: Secondary | ICD-10-CM

## 2019-07-25 DIAGNOSIS — N760 Acute vaginitis: Secondary | ICD-10-CM

## 2019-07-25 MED ORDER — METRONIDAZOLE 500 MG PO TABS: 500.0000 mg | ORAL_TABLET | Freq: Two times a day (BID) | ORAL | 0 refills | Status: DC

## 2019-12-08 ENCOUNTER — Other Ambulatory Visit: Payer: Self-pay

## 2019-12-08 ENCOUNTER — Ambulatory Visit (INDEPENDENT_AMBULATORY_CARE_PROVIDER_SITE_OTHER): Payer: Medicaid Other | Admitting: Obstetrics & Gynecology

## 2019-12-08 ENCOUNTER — Other Ambulatory Visit (HOSPITAL_COMMUNITY)
Admission: RE | Admit: 2019-12-08 | Discharge: 2019-12-08 | Disposition: A | Discharge status: Home / Self Care | Payer: Medicaid Other | Source: Ambulatory Visit | Attending: Obstetrics & Gynecology | Admitting: Obstetrics & Gynecology

## 2019-12-08 ENCOUNTER — Encounter: Payer: Self-pay | Admitting: Obstetrics & Gynecology

## 2019-12-08 VITALS — BP 116/77 | HR 90 | Wt 137.0 lb

## 2019-12-08 DIAGNOSIS — N760 Acute vaginitis: Secondary | ICD-10-CM

## 2019-12-08 DIAGNOSIS — N898 Other specified noninflammatory disorders of vagina: Secondary | ICD-10-CM | POA: Insufficient documentation

## 2019-12-08 DIAGNOSIS — R87810 Cervical high risk human papillomavirus (HPV) DNA test positive: Secondary | ICD-10-CM | POA: Diagnosis not present

## 2019-12-08 DIAGNOSIS — R102 Pelvic and perineal pain unspecified side: Secondary | ICD-10-CM

## 2019-12-08 DIAGNOSIS — Z1231 Encounter for screening mammogram for malignant neoplasm of breast: Secondary | ICD-10-CM | POA: Diagnosis not present

## 2019-12-08 DIAGNOSIS — Z113 Encounter for screening for infections with a predominantly sexual mode of transmission: Secondary | ICD-10-CM

## 2019-12-08 DIAGNOSIS — B9689 Other specified bacterial agents as the cause of diseases classified elsewhere: Secondary | ICD-10-CM

## 2019-12-08 LAB — POCT URINALYSIS DIPSTICK: Blood, UA: NEGATIVE

## 2019-12-08 NOTE — Progress Notes (Signed)
GYNECOLOGY OFFICE VISIT NOTE  History:   Sara Krueger is a 44 y.o. [id number] here today for increase in vaginal discharge and pain on left side for 3 weeks.  Had the same symptoms a few months ago when she had vaginitis.  Also reported burning during urination. She denies any abnormal bleeding or other concerns.  She does request STI screen, has a new partner.    Past Medical History:  Diagnosis Date  . Depression    no meds  . SVD (spontaneous vaginal delivery)    x 4    Past Surgical History:  Procedure Laterality Date  . LAPAROSCOPIC TUBAL LIGATION Bilateral 05/21/2018   Procedure: LAPAROSCOPIC TUBAL LIGATION;  Surgeon: Emily Filbert, MD;  Location: [facility];  Service: Gynecology;  Laterality: Bilateral;  with filshie Clips   . LAPAROSCOPY N/A 05/21/2018   Procedure: LAPAROSCOPY DIAGNOSTIC;  Surgeon: Emily Filbert, MD;  Location: [facility];  Service: Gynecology;  Laterality: N/A;  removal of two filshe clips in abdomen from a previous procedure   . TUBAL LIGATION  2006   Failed Tubal    The following portions of the patient's history were reviewed and updated as appropriate: allergies, current medications, past family history, past medical history, past social history, past surgical history and problem list.   Health Maintenance:  Normal pap but positive HRHPV on 12/20/2017.  Normal mammogram on 01/09/2018.   Review of Systems:  Pertinent items noted in HPI and remainder of comprehensive ROS otherwise negative.  Physical Exam:  BP 116/77   Pulse 90   Wt 137 lb (62.1 kg)   LMP 12/03/2019 (Exact Date)   BMI 21.46 kg/m  CONSTITUTIONAL: Well-developed, well-nourished female in no acute distress.  SKIN: Skin is warm and dry. No rash noted. Not diaphoretic. No erythema. No pallor. MUSCULOSKELETAL: Normal range of motion. No tenderness.   CARDIOVASCULAR: Normal heart rate noted. RESPIRATORY: Effort and breath sounds normal, no problems with respiration noted. ABDOMEN: Soft, no distention  noted.  No tenderness on palpation, rebound or guarding.  PELVIC: Normal appearing external genitalia and urethral meatus; normal appearing vaginal mucosa and cervix.  Copious amount of yellow abnormal discharge noted, testing sample obtained.  Pap smear obtained, some bleeding noted for her prominent cervical ectropion.  Normal uterine size, normal adnexa, no other palpable masses, no uterine or adnexal tenderness.   Assessment and Plan:    1. Vaginal discharge Will follow up results and manage accordingly - Cervicovaginal ancillary only  2. Pelvic pain in female - Cervicovaginal ancillary only - Urine Culture done as POCT UDip showed small LE. Will follow up results and manage accordingly  3. Papanicolaou smear of cervix with positive high risk human papilloma virus (HPV) test - Cytology - PAP done.  Will follow up results and manage accordingly.  4. Breast cancer screening by mammogram Mammogram ordered. - MM 3D SCREEN BREAST BILATERAL; Future  5. Screen for STD (sexually transmitted disease) STI screen done as requested. Will follow up results and manage accordingly - Hepatitis B surface antigen - Hepatitis C antibody - HIV Antibody (routine testing w rflx) - RPR  Routine preventative health maintenance measures emphasized. Please refer to After Visit Summary for other counseling recommendations.   Return for any gynecologic concerns.    Total face-to-face time with patient: 15 minutes.  Over 50% of encounter was spent on counseling and coordination of care.   Verita Schneiders, MD, [place] for [employer], [facility]

## 2019-12-08 NOTE — Patient Instructions (Signed)
vagu

## 2019-12-09 LAB — HEPATITIS C ANTIBODY: Hep C Virus Ab: <0.1 s/co ratio (ref 0.0–0.9)

## 2019-12-09 LAB — RPR: RPR Ser Ql: NONREACTIVE

## 2019-12-09 LAB — URINE CULTURE: Organism ID, Bacteria: NO GROWTH

## 2019-12-09 LAB — HEPATITIS B SURFACE ANTIGEN: Hepatitis B Surface Ag: NEGATIVE

## 2019-12-09 LAB — HIV ANTIBODY (ROUTINE TESTING W REFLEX): HIV Screen 4th Generation wRfx: NONREACTIVE

## 2019-12-10 LAB — CERVICOVAGINAL ANCILLARY ONLY
Bacterial Vaginitis (gardnerella): POSITIVE — AB
Candida Glabrata: NEGATIVE
Candida Vaginitis: NEGATIVE
Chlamydia: NEGATIVE
Comment: NEGATIVE
Comment: NEGATIVE
Comment: NEGATIVE
Comment: NEGATIVE
Comment: NEGATIVE
Comment: NORMAL
Neisseria Gonorrhea: NEGATIVE
Trichomonas: NEGATIVE

## 2019-12-10 MED ORDER — METRONIDAZOLE 500 MG PO TABS: 500.0000 mg | ORAL_TABLET | Freq: Two times a day (BID) | ORAL | 1 refills | Status: DC

## 2019-12-10 NOTE — Addendum Note (Signed)
Addended by: Jaynie Collins A on: 12/10/2019 03:07 PM   Modules accepted: Orders

## 2019-12-11 LAB — CYTOLOGY - PAP
Comment: NEGATIVE
Diagnosis: NEGATIVE
High risk HPV: NEGATIVE

## 2019-12-12 MED ORDER — AEROCHAMBER PLUS FLO-VU LARGE MISC
Status: AC
  Filled 2019-12-12: qty 1

## 2020-01-27 ENCOUNTER — Ambulatory Visit
Admission: RE | Admit: 2020-01-27 | Discharge: 2020-01-27 | Disposition: A | Discharge status: Home / Self Care | Payer: Medicaid Other | Source: Ambulatory Visit | Attending: Obstetrics & Gynecology | Admitting: Obstetrics & Gynecology

## 2020-01-27 ENCOUNTER — Other Ambulatory Visit: Payer: Self-pay

## 2020-01-27 DIAGNOSIS — Z1231 Encounter for screening mammogram for malignant neoplasm of breast: Secondary | ICD-10-CM

## 2020-02-19 ENCOUNTER — Other Ambulatory Visit: Payer: Self-pay

## 2020-02-19 ENCOUNTER — Ambulatory Visit (INDEPENDENT_AMBULATORY_CARE_PROVIDER_SITE_OTHER): Payer: Medicaid Other | Admitting: Obstetrics & Gynecology

## 2020-02-19 ENCOUNTER — Other Ambulatory Visit (HOSPITAL_COMMUNITY)
Admission: RE | Admit: 2020-02-19 | Discharge: 2020-02-19 | Disposition: A | Discharge status: Home / Self Care | Payer: Medicaid Other | Source: Ambulatory Visit | Attending: Obstetrics & Gynecology | Admitting: Obstetrics & Gynecology

## 2020-02-19 ENCOUNTER — Encounter: Payer: Self-pay | Admitting: Obstetrics & Gynecology

## 2020-02-19 VITALS — BP 118/80 | HR 73 | Wt 134.0 lb

## 2020-02-19 DIAGNOSIS — N760 Acute vaginitis: Secondary | ICD-10-CM | POA: Diagnosis not present

## 2020-02-19 DIAGNOSIS — N939 Abnormal uterine and vaginal bleeding, unspecified: Secondary | ICD-10-CM | POA: Diagnosis not present

## 2020-02-19 LAB — CBC
Hematocrit: 44 % (ref 34.0–46.6)
Hemoglobin: 14.9 g/dL (ref 11.1–15.9)
MCH: 31.2 pg (ref 26.6–33.0)
MCHC: 33.9 g/dL (ref 31.5–35.7)
MCV: 92 fL (ref 79–97)
Platelets: 216 10*3/uL (ref 150–450)
RBC: 4.77 x10E6/uL (ref 3.77–5.28)
RDW: 13.2 % (ref 11.7–15.4)
WBC: 6 10*3/uL (ref 3.4–10.8)

## 2020-02-19 MED ORDER — METRONIDAZOLE 500 MG PO TABS: 500.0000 mg | ORAL_TABLET | Freq: Two times a day (BID) | ORAL | 1 refills | Status: DC

## 2020-02-19 MED ORDER — MEGESTROL ACETATE 40 MG PO TABS: 40.0000 mg | ORAL_TABLET | Freq: Every day | ORAL | 5 refills | Status: DC

## 2020-02-19 NOTE — Progress Notes (Signed)
GYNECOLOGY OFFICE VISIT NOTE  History:   Sara Krueger is a 44 y.o. [id number] here today for evaluation of recurrent bacterial vaginitis and also for abnormal uterine bleeding.  Had recurrent BV episodes over the last year, about 3-4 episodes.  Also in the last two months, she noticed AUB almost every day, mostly light bleeding/spotting recently. Associated with mild cramping.  Feels tired and lightheaded. Desires to discuss hysterectomy, reports family history of uterine cancer. History of possible 7 mm submucosal fibroid seen on 2019 ultrasound.  She denies any other concerns.    Past Medical History:  Diagnosis Date  . Depression    no meds  . SVD (spontaneous vaginal delivery)    x 4    Past Surgical History:  Procedure Laterality Date  . LAPAROSCOPIC TUBAL LIGATION Bilateral 05/21/2018   Procedure: LAPAROSCOPIC TUBAL LIGATION;  Surgeon: Emily Filbert, MD;  Location: [facility];  Service: Gynecology;  Laterality: Bilateral;  with filshie Clips   . LAPAROSCOPY N/A 05/21/2018   Procedure: LAPAROSCOPY DIAGNOSTIC;  Surgeon: Emily Filbert, MD;  Location: [facility];  Service: Gynecology;  Laterality: N/A;  removal of two filshe clips in abdomen from a previous procedure   . TUBAL LIGATION  2006   Failed Tubal   The following portions of the patient's history were reviewed and updated as appropriate: allergies, current medications, past family history, past medical history, past social history, past surgical history and problem list.   Health Maintenance:  Normal pap and negative HRHPV on 12/08/2019.  Normal mammogram on 01/27/2020.   Review of Systems:  Pertinent items noted in HPI and remainder of comprehensive ROS otherwise negative.  Physical Exam:  BP 118/80   Pulse 73   Wt 134 lb (60.8 kg)   BMI 20.99 kg/m  CONSTITUTIONAL: Well-developed, well-nourished female in no acute distress.  HEENT:  Normocephalic, atraumatic. External right and left ear normal. No scleral icterus.  NECK: Normal  range of motion, supple, no masses noted on observation SKIN: No rash noted. Not diaphoretic. No erythema. No pallor. MUSCULOSKELETAL: Normal range of motion. No edema noted. NEUROLOGIC: Alert and oriented to person, place, and time. Normal muscle tone coordination. No cranial nerve deficit noted. PSYCHIATRIC: Normal mood and affect. Normal behavior. Normal judgment and thought content. CARDIOVASCULAR: Normal heart rate noted RESPIRATORY: Effort and breath sounds normal, no problems with respiration noted ABDOMEN: No masses noted. No other overt distention noted.   PELVIC: Normal appearing external genitalia; normal urethral meatus; normal appearing vaginal mucosa.  Copious amount of yellow, thin, malodorous discharge noted, testing sample obtained. Friable appearing cervical ectropion.  No active spotting or bleeding noted.  Normal uterine size, no other palpable masses, no uterine or adnexal tenderness. Performed in the presence of a chaperone  Labs and Imaging CBC Latest Ref Rng & Units 05/28/2018 05/13/2018 12/20/2017  WBC 3.4 - 10.8 x10E3/uL 7.5 6.8 6.9  Hemoglobin 11.1 - 15.9 g/dL 14.5 15.3(H) 14.7  Hematocrit 34.0 - 46.6 % 41.3 41.9 42.3  Platelets 150 - 450 x10E3/uL 192 210 198   MM 3D SCREEN BREAST BILATERAL  Result Date: 01/27/2020 CLINICAL DATA:  Screening. EXAM: DIGITAL SCREENING BILATERAL MAMMOGRAM WITH TOMO AND CAD COMPARISON:  Previous exam(s). ACR Breast Density Category b: There are scattered areas of fibroglandular density. FINDINGS: There are no findings suspicious for malignancy. Images were processed with CAD. IMPRESSION: No mammographic evidence of malignancy. A result letter of this screening mammogram will be mailed directly to the patient. RECOMMENDATION: Screening mammogram in one  year. (Code:SM-B-01Y) BI-RADS CATEGORY  1: Negative. Electronically Signed   By: Britta Mccreedy M.D.   On: 01/27/2020 11:58      Assessment and Plan:    1. Abnormal uterine bleeding (AUB) Will  check labs. Also follow up discharge testing.  Ultrasound ordered given possible submucosal fibroid. Endometrial biopsy recommended given AUB, she wants to defer to next visit and also be premedicated with Ibuprofen prior to that. Will follow up results and manage accordingly; surgical and medical options discussed briefly. Megace prescribed as needed for now, bleeding precautions reviewed. - US PELVIC COMPLETE WITH TRANSVAGINAL; Future - megestrol (MEGACE) 40 MG tablet; Take 1 tablet (40 mg total) by mouth daily. Can increase to two tablets daily or twice a day in the event of heavy bleeding  Dispense: 60 tablet; Refill: 5 - CBC - TSH - Beta hCG quant (ref lab)  2. Recurrent vaginitis Likely BV, Metronidazole prescribed. If still recurrent (in absence of continued AUB), will treat with extended metronidazole gel therapy. - Cervicovaginal ancillary only( [place]) - metroNIDAZOLE (FLAGYL) 500 MG tablet; Take 1 tablet (500 mg total) by mouth 2 (two) times daily.  Dispense: 14 tablet; Refill: 1   Routine preventative health maintenance measures emphasized. Please refer to After Visit Summary for other counseling recommendations.   Return in about 2 weeks (around 03/04/2020) for Followup and endometrial biopsy.    Total face-to-face time with patient: 20 minutes.  Over 50% of encounter was spent on counseling and coordination of care.   Jaynie Collins, MD, FACOG Obstetrician & Gynecologist, [facility] for [employer], [facility]

## 2020-02-19 NOTE — Patient Instructions (Addendum)
Take Ibuprofen 800 mg with food before next appointment   Endometrial Biopsy  Endometrial biopsy is a procedure in which a tissue sample is taken from inside the uterus. The sample is taken from the endometrium, which is the lining of the uterus. The tissue sample is then checked under a microscope to see if the tissue is normal or abnormal. This procedure helps to determine where you are in your menstrual cycle and how hormone levels are affecting the lining of the uterus. This procedure may also be used to evaluate uterine bleeding or to diagnose endometrial cancer, endometrial tuberculosis, polyps, or other inflammatory conditions. Tell a health care provider about:  Any allergies you have.  All medicines you are taking, including vitamins, herbs, eye drops, creams, and over-the-counter medicines.  Any problems you or family members have had with anesthetic medicines.  Any blood disorders you have.  Any surgeries you have had.  Any medical conditions you have.  Whether you are pregnant or may be pregnant. What are the risks? Generally, this is a safe procedure. However, problems may occur, including:  Bleeding.  Pelvic infection.  Puncture of the wall of the uterus with the biopsy device (rare). What happens before the procedure?  Keep a record of your menstrual cycles as told by your health care provider. You may need to schedule your procedure for a specific time in your cycle.  You may want to bring a sanitary pad to wear after the procedure.  Ask your health care provider about: ? Changing or stopping your regular medicines. This is especially important if you are taking diabetes medicines or blood thinners. ? Taking medicines such as aspirin and ibuprofen. These medicines can thin your blood. Do not take these medicines before your procedure if your health care provider instructs you not to.  Plan to have someone take you home from the hospital or clinic. What  happens during the procedure?  To lower your risk of infection: ? Your health care team will wash or sanitize their hands.  You will lie on an exam table with your feet and legs supported as in a pelvic exam.  Your health care provider will insert an instrument (speculum) into your vagina to see your cervix.  Your cervix will be cleansed with an antiseptic solution.  A medicine (local anesthetic) will be used to numb the cervix.  A forceps instrument (tenaculum) will be used to hold your cervix steady for the biopsy.  A thin, rod-like instrument (uterine sound) will be inserted through your cervix to determine the length of your uterus and the location where the biopsy sample will be removed.  A thin, flexible tube (catheter) will be inserted through your cervix and into the uterus. The catheter will be used to collect the biopsy sample from your endometrial tissue.  The catheter and speculum will then be removed, and the tissue sample will be sent to a lab for examination. What happens after the procedure?  You will rest in a recovery area until you are ready to go home.  You may have mild cramping and a small amount of vaginal bleeding. This is normal.  It is up to you to get the results of your procedure. Ask your health care provider, or the department that is doing the procedure, when your results will be ready. Summary  Endometrial biopsy is a procedure in which a tissue sample is taken from the endometrium, which is the lining of the uterus.  This procedure may help  to diagnose menstrual cycle problems, abnormal bleeding, or other conditions affecting the endometrium.  Before the procedure, keep a record of your menstrual cycles as told by your health care provider.  The tissue sample that is removed will be checked under a microscope to see if it is normal or abnormal. This information is not intended to replace advice given to you by your health care provider. Make sure  you discuss any questions you have with your health care provider. Document Revised: 10/12/2017 Document Reviewed: 11/15/2016 Elsevier Patient Education  2020 Elsevier Inc. Abnormal Uterine Bleeding Abnormal uterine bleeding is unusual bleeding from the uterus. It includes:  Bleeding or spotting between periods.  Bleeding after sex.  Bleeding that is heavier than normal.  Periods that last longer than usual.  Bleeding after menopause. Abnormal uterine bleeding can affect women at various stages in life, including teenagers, women in their reproductive years, pregnant women, and women who have reached menopause. Common causes of abnormal uterine bleeding include:  Pregnancy.  Growths of tissue (polyps).  A noncancerous tumor in the uterus (fibroid).  Infection.  Cancer.  Hormonal imbalances. Any type of abnormal bleeding should be evaluated by a health care provider. Many cases are minor and simple to treat, while others are more serious. Treatment will depend on the cause of the bleeding. Follow these instructions at home:  Monitor your condition for any changes.  Do not use tampons, douche, or have sex if told by your health care provider.  Change your pads often.  Get regular exams that include pelvic exams and cervical cancer screening.  Keep all follow-up visits as told by your health care provider. This is important. Contact a health care provider if:  Your bleeding lasts for more than one week.  You feel dizzy at times.  You feel nauseous or you vomit. Get help right away if:  You pass out.  Your bleeding soaks through a pad every hour.  You have abdominal pain.  You have a fever.  You become sweaty or weak.  You pass large blood clots from your vagina. Summary  Abnormal uterine bleeding is unusual bleeding from the uterus.  Any type of abnormal bleeding should be evaluated by a health care provider. Many cases are minor and simple to treat,  while others are more serious.  Treatment will depend on the cause of the bleeding. This information is not intended to replace advice given to you by your health care provider. Make sure you discuss any questions you have with your health care provider. Document Revised: 02/06/2018 Document Reviewed: 12/01/2016 Elsevier Patient Education  2020 [employer].    Vaginitis Vaginitis is a condition in which the vaginal tissue swells and becomes red (inflamed). This condition is most often caused by a change in the normal balance of bacteria and yeast that live in the vagina. This change causes an overgrowth of certain bacteria or yeast, which causes the inflammation. There are different types of vaginitis, but the most common types are:  Bacterial vaginosis.  Yeast infection (candidiasis).  Trichomoniasis vaginitis. This is a sexually transmitted disease (STD).  Viral vaginitis.  Atrophic vaginitis.  Allergic vaginitis. What are the causes? The cause of this condition depends on the type of vaginitis. It can be caused by:  Bacteria (bacterial vaginosis).  Yeast, which is a fungus (yeast infection).  A parasite (trichomoniasis vaginitis).  A virus (viral vaginitis).  Low hormone levels (atrophic vaginitis). Low hormone levels can occur during pregnancy, breastfeeding, or after menopause.  Irritants, such as bubble baths, scented tampons, and feminine sprays (allergic vaginitis). Other factors can change the normal balance of the yeast and bacteria that live in the vagina. These include:  Antibiotic medicines.  Poor hygiene.  Diaphragms, vaginal sponges, spermicides, birth control pills, and intrauterine devices (IUD).  Sex.  Infection.  Uncontrolled diabetes.  A weakened defense (immune) system. What increases the risk? This condition is more likely to develop in women who:  Smoke.  Use vaginal douches, scented tampons, or scented sanitary pads.  Wear  tight-fitting pants.  Wear thong underwear.  Use oral birth control pills or an IUD.  Have sex without a condom.  Have multiple sex partners.  Have an STD.  Frequently use the spermicide nonoxynol-9.  Eat lots of foods high in sugar.  Have uncontrolled diabetes.  Have low estrogen levels.  Have a weakened immune system from an immune disorder or medical treatment.  Are pregnant or breastfeeding. What are the signs or symptoms? Symptoms vary depending on the cause of the vaginitis. Common symptoms include:  Abnormal vaginal discharge. ? The discharge is white, gray, or yellow with bacterial vaginosis. ? The discharge is thick, white, and cheesy with a yeast infection. ? The discharge is frothy and yellow or greenish with trichomoniasis.  A bad vaginal smell. The smell is fishy with bacterial vaginosis.  Vaginal itching, pain, or swelling.  Sex that is painful.  Pain or burning when urinating. Sometimes there are no symptoms. How is this diagnosed? This condition is diagnosed based on your symptoms and medical history. A physical exam, including a pelvic exam, will also be done. You may also have other tests, including:  Tests to determine the pH level (acidity or alkalinity) of your vagina.  A whiff test, to assess the odor that results when a sample of your vaginal discharge is mixed with a potassium hydroxide solution.  Tests of vaginal fluid. A sample will be examined under a microscope. How is this treated? Treatment varies depending on the type of vaginitis you have. Your treatment may include:  Antibiotic creams or pills to treat bacterial vaginosis and trichomoniasis.  Antifungal medicines, such as vaginal creams or suppositories, to treat a yeast infection.  Medicine to ease discomfort if you have viral vaginitis. Your sexual partner should also be treated.  Estrogen delivered in a cream, pill, suppository, or vaginal ring to treat atrophic vaginitis. If  vaginal dryness occurs, lubricants and moisturizing creams may help. You may need to avoid scented soaps, sprays, or douches.  Stopping use of a product that is causing allergic vaginitis. Then using a vaginal cream to treat the symptoms. Follow these instructions at home: Lifestyle  Keep your genital area clean and dry. Avoid soap, and only rinse the area with water.  Do not douche or use tampons until your health care provider says it is okay to do so. Use sanitary pads, if needed.  Do not have sex until your health care provider approves. When you can return to sex, practice safe sex and use condoms.  Wipe from front to back. This avoids the spread of bacteria from the rectum to the vagina. General instructions  Take over-the-counter and prescription medicines only as told by your health care provider.  If you were prescribed an antibiotic medicine, take or use it as told by your health care provider. Do not stop taking or using the antibiotic even if you start to feel better.  Keep all follow-up visits as told by your health care  provider. This is important. How is this prevented?  Use mild, non-scented products. Do not use things that can irritate the vagina, such as fabric softeners. Avoid the following products if they are scented: ? Feminine sprays. ? Detergents. ? Tampons. ? Feminine hygiene products. ? Soaps or bubble baths.  Let air reach your genital area. ? Wear cotton underwear to reduce moisture buildup. ? Avoid wearing underwear while you sleep. ? Avoid wearing tight pants and underwear or nylons without a cotton panel. ? Avoid wearing thong underwear.  Take off any wet clothing, such as bathing suits, as soon as possible.  Practice safe sex and use condoms. Contact a health care provider if:  You have abdominal pain.  You have a fever.  You have symptoms that last for more than 2-3 days. Get help right away if:  You have a fever and your symptoms  suddenly get worse. Summary  Vaginitis is a condition in which the vaginal tissue becomes inflamed.This condition is most often caused by a change in the normal balance of bacteria and yeast that live in the vagina.  Treatment varies depending on the type of vaginitis you have.  Do not douche, use tampons , or have sex until your health care provider approves. When you can return to sex, practice safe sex and use condoms. This information is not intended to replace advice given to you by your health care provider. Make sure you discuss any questions you have with your health care provider. Document Revised: 10/12/2017 Document Reviewed: 12/05/2016 Elsevier Patient Education  2020 [employer].

## 2020-02-20 LAB — CERVICOVAGINAL ANCILLARY ONLY
Bacterial Vaginitis (gardnerella): POSITIVE — AB
Candida Glabrata: NEGATIVE
Candida Vaginitis: NEGATIVE
Chlamydia: NEGATIVE
Comment: NEGATIVE
Comment: NEGATIVE
Comment: NEGATIVE
Comment: NEGATIVE
Comment: NEGATIVE
Comment: NORMAL
Neisseria Gonorrhea: NEGATIVE
Trichomonas: NEGATIVE

## 2020-02-20 LAB — BETA HCG QUANT (REF LAB): hCG Quant: <1 m[IU]/mL

## 2020-02-20 LAB — TSH: TSH: 1.26 u[IU]/mL (ref 0.450–4.500)

## 2020-02-22 MED ORDER — METRONIDAZOLE 0.75 % VA GEL: 1.0000 | Freq: Every day | VAGINAL | 6 refills | Status: DC

## 2020-02-22 NOTE — Addendum Note (Signed)
Addended by: Jaynie Collins A on: 02/22/2020 11:50 AM   Modules accepted: Orders

## 2020-02-27 ENCOUNTER — Ambulatory Visit
Admission: RE | Admit: 2020-02-27 | Discharge: 2020-02-27 | Disposition: A | Discharge status: Home / Self Care | Payer: Medicaid Other | Source: Ambulatory Visit | Attending: Obstetrics & Gynecology | Admitting: Obstetrics & Gynecology

## 2020-02-27 DIAGNOSIS — N939 Abnormal uterine and vaginal bleeding, unspecified: Secondary | ICD-10-CM

## 2020-03-01 ENCOUNTER — Encounter: Payer: Self-pay | Admitting: *Deleted

## 2020-03-08 ENCOUNTER — Other Ambulatory Visit: Payer: Self-pay | Admitting: *Deleted

## 2020-03-22 ENCOUNTER — Other Ambulatory Visit: Payer: Medicaid Other | Admitting: Obstetrics & Gynecology

## 2020-03-26 ENCOUNTER — Encounter: Payer: Self-pay | Admitting: Physical Therapy

## 2020-03-26 ENCOUNTER — Ambulatory Visit: Payer: Medicaid Other | Attending: Family Medicine | Admitting: Physical Therapy

## 2020-03-26 ENCOUNTER — Other Ambulatory Visit: Payer: Self-pay

## 2020-03-26 DIAGNOSIS — R262 Difficulty in walking, not elsewhere classified: Secondary | ICD-10-CM | POA: Insufficient documentation

## 2020-03-26 DIAGNOSIS — M2550 Pain in unspecified joint: Secondary | ICD-10-CM | POA: Insufficient documentation

## 2020-03-26 NOTE — Therapy (Addendum)
[place], [place], [address] Phone: [phone]   Fax:  [phone]  Physical Therapy Evaluation/Discharge  Patient Details  Name: Sara Krueger MRN: [id number] Date of Birth: 11-04-1976 Referring Provider (PT): Drue Flirt, MD   Encounter Date: 03/26/2020  PT End of Session - 03/26/20 1109    Visit Number  1    Date for PT Re-Evaluation  04/30/20   incr time to allow for MCD auth   Authorization Type  MCD- auth submitted 5/14    PT Start Time  1100    PT Stop Time  1136    PT Time Calculation (min)  36 min    Activity Tolerance  Patient tolerated treatment well    Behavior During Therapy  [facility] for tasks assessed/performed       Past Medical History:  Diagnosis Date  . Depression    no meds  . SVD (spontaneous vaginal delivery)    x 4    Past Surgical History:  Procedure Laterality Date  . LAPAROSCOPIC TUBAL LIGATION Bilateral 05/21/2018   Procedure: LAPAROSCOPIC TUBAL LIGATION;  Surgeon: Emily Filbert, MD;  Location: [facility];  Service: Gynecology;  Laterality: Bilateral;  with filshie Clips   . LAPAROSCOPY N/A 05/21/2018   Procedure: LAPAROSCOPY DIAGNOSTIC;  Surgeon: Emily Filbert, MD;  Location: [facility];  Service: Gynecology;  Laterality: N/A;  removal of two filshe clips in abdomen from a previous procedure   . TUBAL LIGATION  2006   Failed Tubal    There were no vitals filed for this visit.   Subjective Assessment - 03/26/20 1103    Subjective  Both knees hurt all the time. The pain is deep in the knees, I can feel it when I touch it, frequent feet cramps. Works as an Educational psychologist. Lt one feels like it is going to give out. Wears inserts in her shoes that she bought online. Knees are better in comfortable shoes.    Patient Stated Goals  decr pain, climb stairs, work, riding bike, walking at park    Currently in Pain?  Yes    Pain Score  7     Pain Location  Knee     Pain Orientation  Right;Left    Pain Descriptors / Indicators  Sharp;Stabbing    Aggravating Factors   stairs    Pain Relieving Factors  compression knee brace         [facility] PT Assessment - 03/26/20 0001      Assessment   Medical Diagnosis  back, hip and knee pain    Referring Provider (PT)  Drue Flirt, MD    Onset Date/Surgical Date  --   pain for years, worse about a year ago   Hand Dominance  Right    Prior Therapy  no      Precautions   Precautions  None      Restrictions   Weight Bearing Restrictions  No      Balance Screen   Has the patient fallen in the past 6 months  No      [place] residence    Additional Comments  3 floors      Prior [facility] [facility]      Cognition   Overall Cognitive Status  Within Functional Limits for tasks assessed      Observation/Other Assessments   Focus on Therapeutic  Outcomes (FOTO)   --   n/a MCD     Sensation   Additional Comments  [facility]      Posture/Postural Control   Posture Comments  arch collapse .5cm Lt, 1 cm Rt, anterior pelvic shift      ROM / Strength   AROM / PROM / Strength  Strength      Strength   Strength Assessment Site  Hip;Knee    Right/Left Hip  Right;Left    Right Hip ABduction  4+/5    Left Hip ABduction  4+/5    Right/Left Knee  Right;Left   all knee tests painful   Right Knee Flexion  4+/5    Right Knee Extension  5/5    Left Knee Flexion  5/5    Left Knee Extension  5/5      Palpation   Palpation comment  laterall tracking patella bil- decr pain with medial pull                  Objective measurements completed on examination: See above findings.      [facility] Adult PT Treatment/Exercise - 03/26/20 0001      Exercises   Exercises  Knee/Hip      Knee/Hip Exercises: Supine   Short Arc Quad Sets  Both;Strengthening;15 reps   3s holds     Knee/Hip Exercises: Sidelying   Other Sidelying Knee/Hip  Exercises  hip circles      Manual Therapy   Manual Therapy  Taping    McConnell  medial pull on bil patella               PT Short Term Goals - 03/26/20 1246      PT SHORT TERM GOAL #1   Title  pt will demonstrate proper form and core engagement in short term HEP    Baseline  began educating at eval    Time  5    Period  Weeks    Status  New    [place] Date  04/30/20      PT SHORT TERM GOAL #2   Title  pt will demo 5/5 hip abd strength    Baseline  see flowsheet    Time  5    Period  Weeks    Status  New    Target Date  04/30/20      PT SHORT TERM GOAL #3   Title  Pt will demo control of ankle stability in static SLS    Baseline  significant instability noted at eval    Time  5    Period  Weeks    Status  New    Target Date  04/30/20                Plan - 03/26/20 1243    Clinical Impression Statement  Pt presents to PT with complaints of bilateral knee pain. 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 She wears inserts in her tennis shoes but I think she would benefit from a more sturdy insert than she got over the counter. Bil longitudinal arch collapse with ankle instability in SLS. Mild lack of activation in hip abductors noted. Bil lateral tracking of patella noted and reported feeling better with medial pull of mcconnel taping. Pt will benefit from skilled PT in order to address deficits and meet functional goals.    Personal Factors and Comorbidities  Profession    Examination-Activity Limitations  Lift;Stand;Locomotion Level;Carry;Sit;Sleep;Squat;Stairs    Examination-Participation  Restrictions  Other    Stability/Clinical Decision Making  Stable/Uncomplicated    Clinical Decision Making  Low    Rehab Potential  Good    PT Frequency  1x / week    PT Duration  3 weeks    PT Treatment/Interventions  ADLs/Self Care Home Management;Cryotherapy;Electrical Stimulation;Ultrasound;Moist Heat;Iontophoresis 84m/ml Dexamethasone;Gait  training;Stair training;Functional mobility training;Therapeutic activities;Therapeutic exercise;Balance training;Patient/family education;Neuromuscular re-education;Manual techniques;Taping;Dry needling;Passive range of motion    PT Next Visit Plan  outcome of mcconnel? [facility] training    PT Home Exercise Plan  SAQ with tape, SL hip circles    Consulted and Agree with Plan of Care  Patient       Patient will benefit from skilled therapeutic intervention in order to improve the following deficits and impairments:  Pain, Increased muscle spasms, Decreased strength, Difficulty walking, Decreased balance  Visit Diagnosis: Pain in joint involving multiple sites - Plan: PT plan of care cert/re-cert  Difficulty in walking, not elsewhere classified - Plan: PT plan of care cert/re-cert     Problem List There are no problems to display for this patient.   Gregorey Nabor C. Rosiland Sen PT, DPT 03/26/20 12:51 PM   [facility] [address]Phone: [phone]  Fax:  [phone] Name: Sara GIBBYMRN: [id number] of Birth: 4Nov 06, 1977 PHYSICAL THERAPY DISCHARGE SUMMARY  Visits from Start of Care: 1  Current functional level related to goals / functional outcomes: See above   Remaining deficits: See above   Education / Equipment: Anatomy of condition, POC, HEP, exercise form/rationale  Plan: Patient agrees to discharge.  Patient goals were not met. Patient is being discharged due to not returning since the last visit.  ?????     Christan Ciccarelli C. Carlee Tesfaye PT, DPT 06/17/20 10:04 AM

## 2020-03-30 ENCOUNTER — Other Ambulatory Visit: Payer: Self-pay | Admitting: Obstetrics & Gynecology

## 2020-03-30 ENCOUNTER — Other Ambulatory Visit: Payer: Self-pay

## 2020-03-30 ENCOUNTER — Encounter: Payer: Self-pay | Admitting: Obstetrics & Gynecology

## 2020-03-30 ENCOUNTER — Ambulatory Visit (INDEPENDENT_AMBULATORY_CARE_PROVIDER_SITE_OTHER): Payer: Medicaid Other | Admitting: Obstetrics & Gynecology

## 2020-03-30 VITALS — BP 121/78 | HR 102 | Wt 138.0 lb

## 2020-03-30 DIAGNOSIS — N939 Abnormal uterine and vaginal bleeding, unspecified: Secondary | ICD-10-CM

## 2020-03-30 DIAGNOSIS — Z3202 Encounter for pregnancy test, result negative: Secondary | ICD-10-CM | POA: Diagnosis not present

## 2020-03-30 LAB — POCT URINE PREGNANCY: Preg Test, Ur: NEGATIVE

## 2020-03-30 NOTE — Progress Notes (Signed)
   44 y.o. [id number] here today for evaluation of abnormal uterine bleeding.  Had a negative ultrasound on 02/27/2020, endometrial stripe was 8 mm. Currently having no bleeding on Megace 80 mg po bid. No other concerns.   ENDOMETRIAL BIOPSY PROCEDURE NOTE The indications for endometrial biopsy were reviewed.   Risks of the biopsy including cramping, bleeding, infection, uterine perforation, inadequate specimen and need for additional procedures  were discussed. The patient states she understands and agrees to undergo procedure today. Consent was signed. Time out was performed. Urine HCG was negative. Chaperone present for procedure. A vaginal speculum was placed and her cervix was visualized with no abnormalities.  The cervix was prepped with Betadine. A single-toothed tenaculum was placed on the anterior lip of the cervix to stabilize it. The 3 mm pipelle was introduced into the endometrial cavity without difficulty to a depth of 10 cm, and a moderate amount of tissue was obtained and sent to pathology. The instruments were removed from the patient's vagina. Minimal bleeding from the cervix was noted. The patient tolerated the procedure well. Routine post-procedure instructions were given to the patient.    Patient will be contacted with results and will discuss further management.   Jaynie Collins, MD, FACOG Obstetrician & Gynecologist, [facility] for [employer], [facility]

## 2020-03-30 NOTE — Patient Instructions (Signed)
ENDOMETRIAL BIOPSY POST-PROCEDURE INSTRUCTIONS  1. You may take Ibuprofen, Aleve or Tylenol for pain if needed.  Cramping should resolve within in 24 hours.  2. You may have a small amount of spotting.  You should wear a mini pad for the next few days.  3. You may have intercourse after 48 hours.  4. You need to call if you have any pelvic pain, fever, heavy bleeding or foul smelling vaginal discharge.  5. Shower or bathe as normal  6. We will call you within one week with results

## 2020-04-02 ENCOUNTER — Telehealth: Payer: Self-pay | Admitting: *Deleted

## 2020-04-02 NOTE — Telephone Encounter (Signed)
-----   Message from Tereso Newcomer, MD sent at 04/01/2020  6:08 PM EDT ----- 03/30/20 Endometrium, biopsy - ENDOMETRIOID-TYPE POLYP. SECRETORY ENDOMETRIUM. NO MALIGNANCY IDENTIFIED.  This is benign.  If bleeding continues or medical management is no longer desired, can discuss surgical management of this polyp via hysteroscopy or endometrial ablation.  No urgent need for a procedure given benign pathology and also given that bleeding is currently medically controlled. Please call to inform patient of results and recommendations.  Results were released to MyChart and patient was given recommendations as indicated.

## 2020-04-02 NOTE — Telephone Encounter (Signed)
Called to make sure pt saw results and recommendations from dr Macon Large. Pt is aware and will call if she has any more issues with bleeding.

## 2020-04-09 ENCOUNTER — Encounter: Payer: Self-pay | Admitting: Physical Therapy

## 2020-06-15 ENCOUNTER — Telehealth: Payer: Self-pay | Admitting: *Deleted

## 2020-06-15 NOTE — Telephone Encounter (Signed)
[employer] employee's manager called her this evening to notify her of a Covid 19 work exposure today. Works in the transportation department.  HAW notified via email now. She will await HAW call for testing date in the am.

## 2020-08-14 IMAGING — US US PELVIS COMPLETE WITH TRANSVAGINAL
1 series · 14 of 25 positions shown · non-contrast
Comparison: 12/26/2017

CLINICAL DATA: Abnormal uterine bleeding, prior laparoscopic tubal
ligation

EXAM:
TRANSABDOMINAL AND TRANSVAGINAL ULTRASOUND OF PELVIS
TECHNIQUE: Both transabdominal and transvaginal ultrasound examinations of the
pelvis were performed. Transabdominal technique was performed for
global imaging of the pelvis including uterus, ovaries, adnexal
regions, and pelvic cul-de-sac. It was necessary to proceed with
endovaginal exam following the transabdominal exam to visualize the
endometrium and ovaries.

[Series 1: us pelvis complete with transvaginal · 0.23mm/px · 14 of 82 slices shown]
[im 1/82]
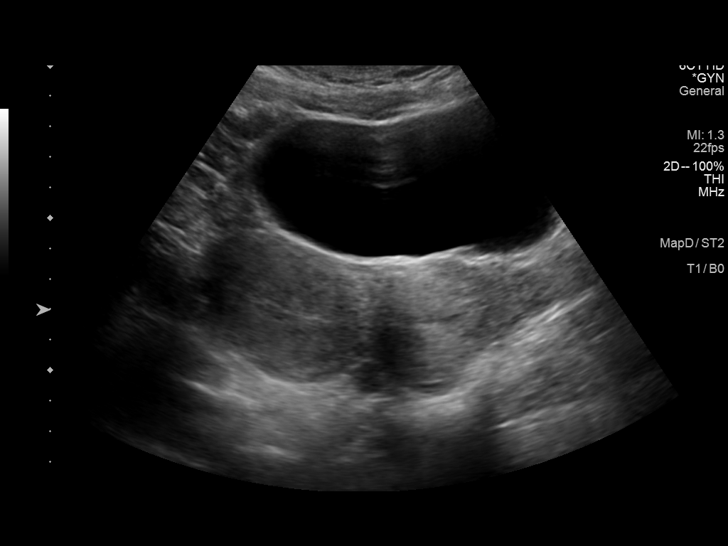
[im 7/82]
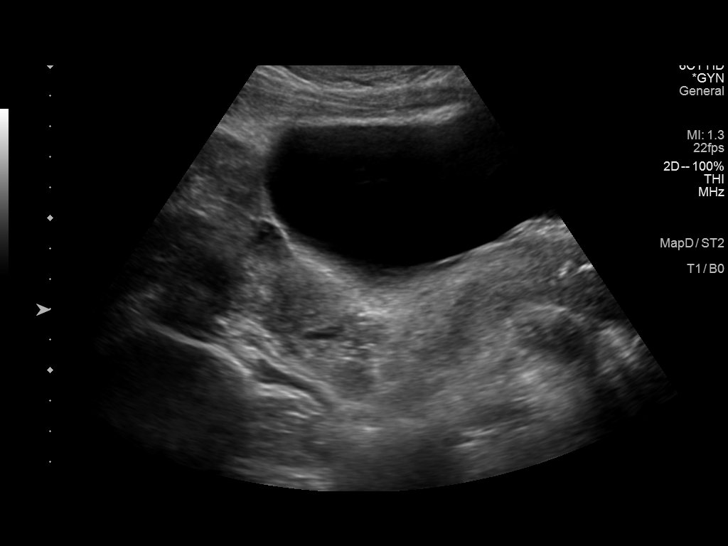
[im 14/82]
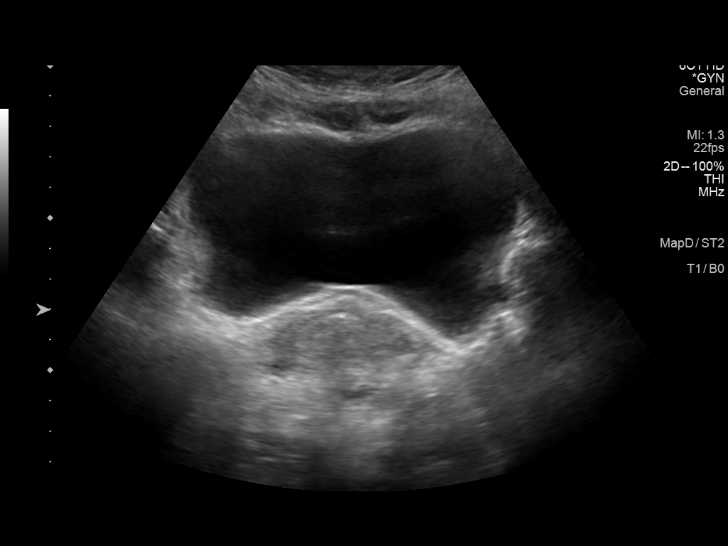
[im 21/82]
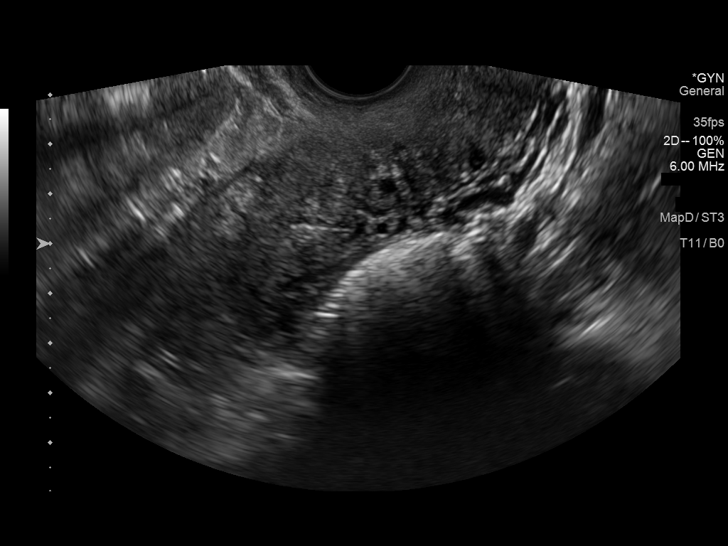
[im 28/82]
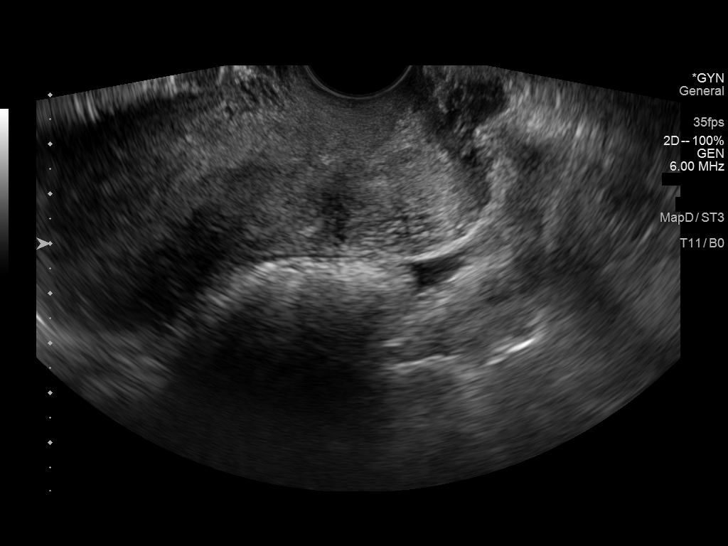
[im 31/82]
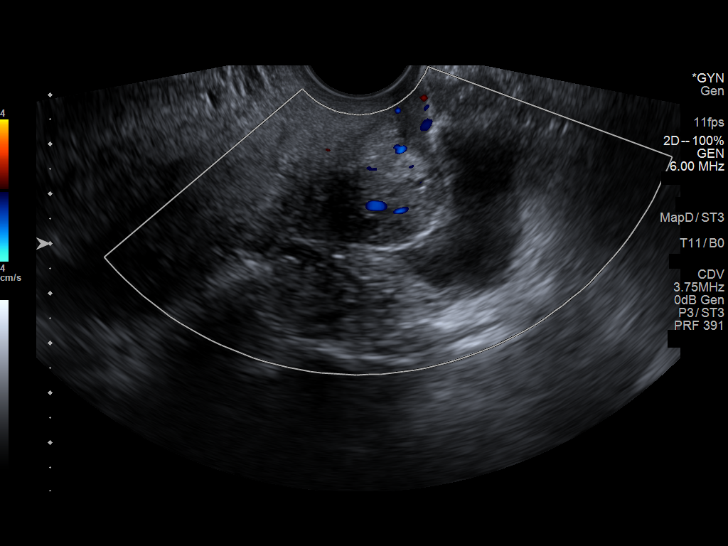
[im 38/82]
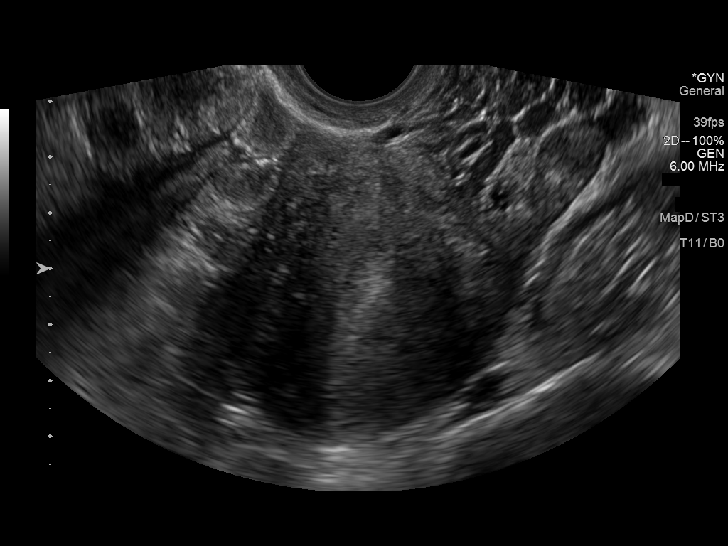
[im 44/82]
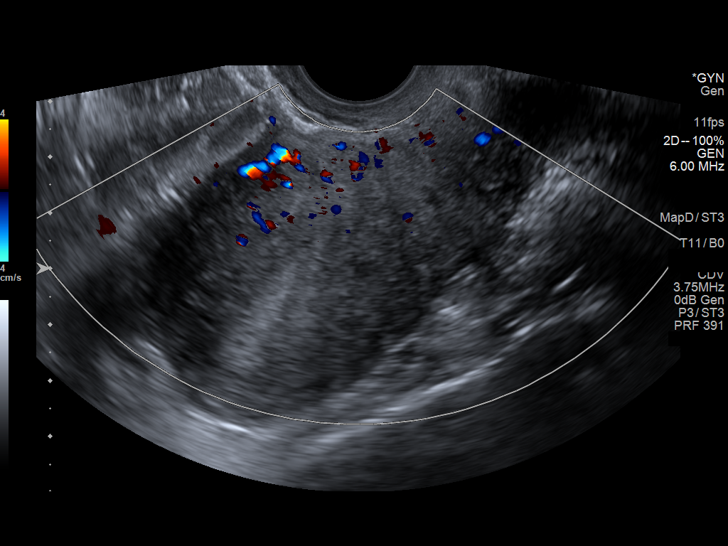
[im 51/82]
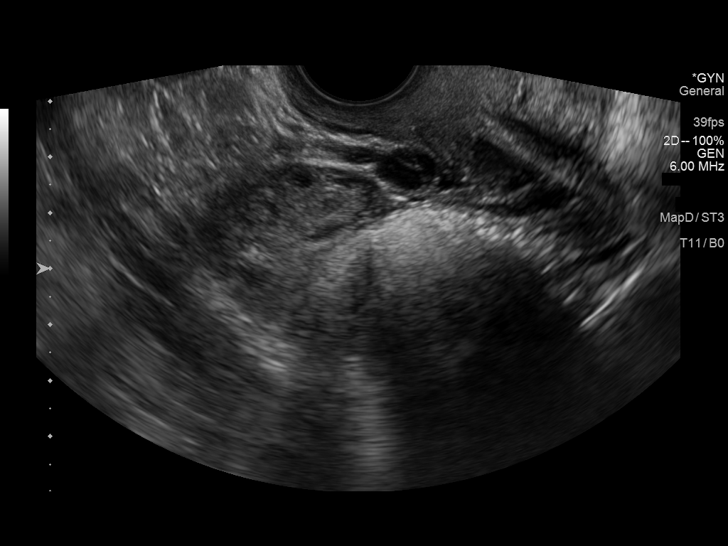
[im 55/82]
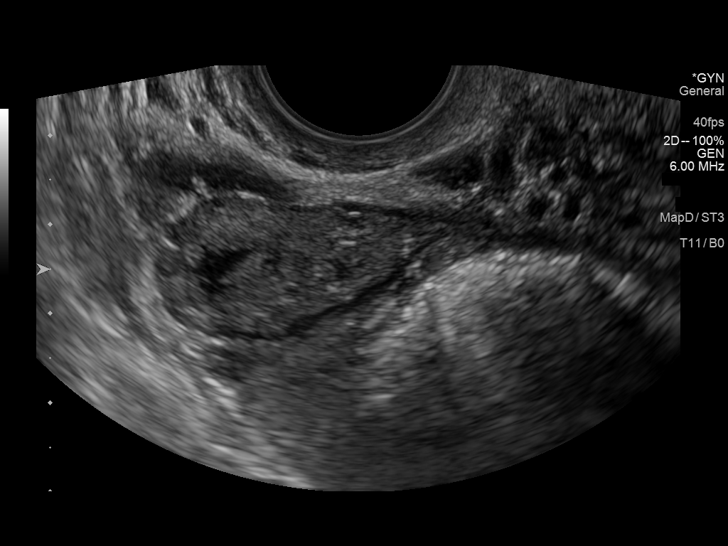
[im 61/82]
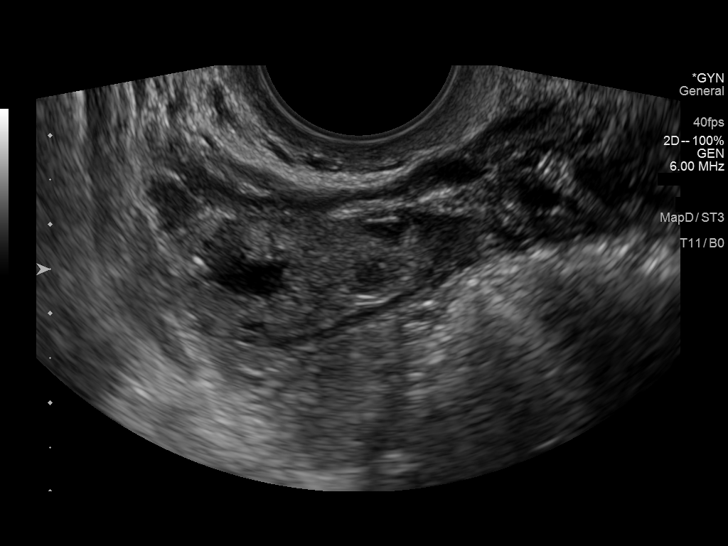
[im 68/82]
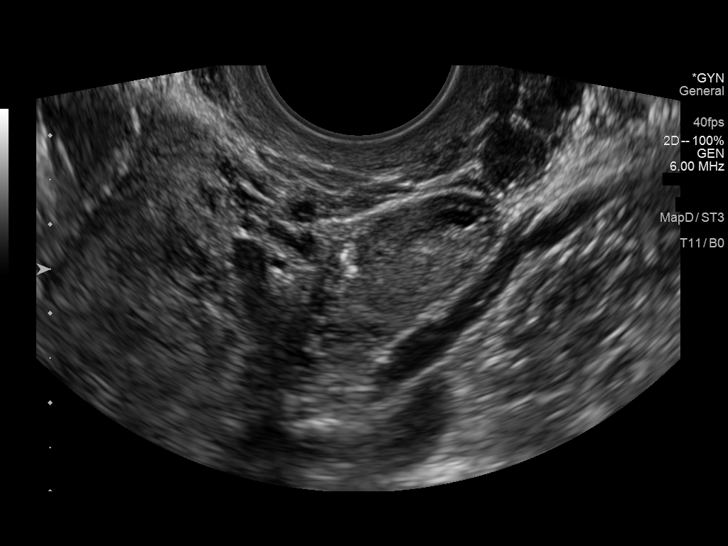
[im 75/82]
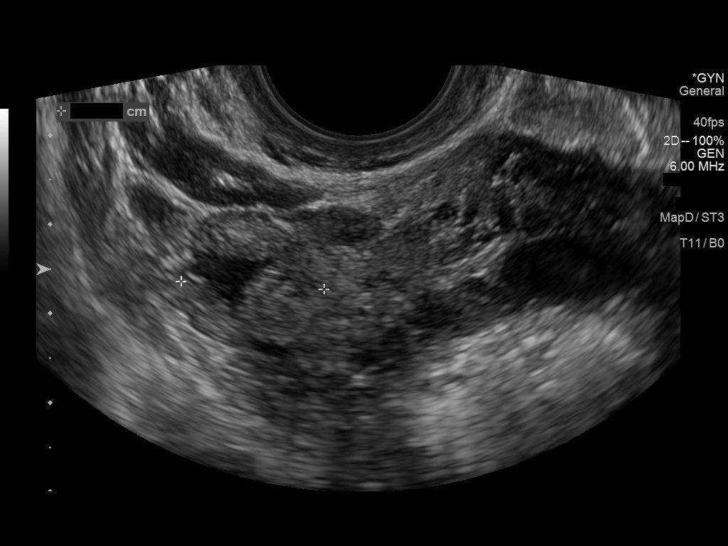
[im 82/82]
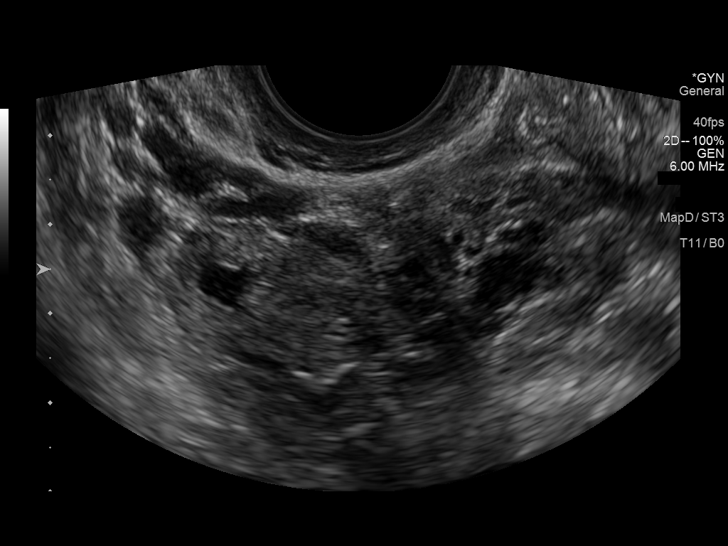

[14 of 25 positions shown; findings below may reference images not displayed]

FINDINGS: Uterus

Measurements: 10.5 x 5.5 x 6.1 cm = volume: 186 mL. Anteverted.
Normal morphology without mass

Endometrium

Thickness: 13 mm.  Trace endometrial fluid.  No focal mass.

Right ovary

Measurements: 3.4 x 1.7 x 2.7 cm = volume: 7.9 mL. Small collapsed
corpus luteum. No additional masses.

Left ovary

Measurements: 3.4 x 1.1 x 1.6 cm = volume: 2.9 mL. Normal morphology
without mass

Other findings

No free pelvic fluid.  No adnexal masses.
IMPRESSION: No pelvic sonographic abnormalities identified.

## 2020-12-18 ENCOUNTER — Other Ambulatory Visit: Payer: Self-pay

## 2020-12-18 ENCOUNTER — Ambulatory Visit (INDEPENDENT_AMBULATORY_CARE_PROVIDER_SITE_OTHER): Payer: Self-pay

## 2020-12-18 DIAGNOSIS — Z23 Encounter for immunization: Secondary | ICD-10-CM

## 2020-12-18 NOTE — Progress Notes (Signed)
   Covid-19 Vaccination Clinic  Name:  Sara Krueger    MRN: [id number] DOB: 1976/02/06  12/18/2020  [name] was observed post Covid-19 immunization for 15 min without incident. She was provided with Vaccine Information Sheet and instruction to access the V-Safe system.   [name] was instructed to call 911 with any severe reactions post vaccine: Marland Kitchen Difficulty breathing  . Swelling of face and throat  . A fast heartbeat  . A bad rash all over body  . Dizziness and weakness   Immunizations Administered    Name Date Dose VIS Date Route   Pfizer COVID-19 Vaccine 12/18/2020  9:30 AM 0.3 mL 09/01/2020 Intramuscular   Manufacturer: [employer], [employer]   Lot: [id number]   NDC: [address]-3

## 2021-01-08 ENCOUNTER — Other Ambulatory Visit: Payer: Self-pay

## 2021-01-08 ENCOUNTER — Ambulatory Visit (INDEPENDENT_AMBULATORY_CARE_PROVIDER_SITE_OTHER): Payer: Medicaid Other

## 2021-01-08 DIAGNOSIS — Z23 Encounter for immunization: Secondary | ICD-10-CM | POA: Diagnosis not present

## 2021-01-08 NOTE — Progress Notes (Signed)
   Covid-19 Vaccination Clinic  Name:  TAUNYA GORAL    MRN: [id number] DOB: 08-06-1976  01/08/2021  [name] was observed post Covid-19 immunization for 15 minutes without incident. She was provided with Vaccine Information Sheet and instruction to access the V-Safe system.   [name] was instructed to call 911 with any severe reactions post vaccine: Marland Kitchen Difficulty breathing  . Swelling of face and throat  . A fast heartbeat  . A bad rash all over body  . Dizziness and weakness   Immunizations Administered    Name Date Dose VIS Date Route   PFIZER Comrnaty(Gray TOP) Covid-19 Vaccine 01/08/2021  9:21 AM 0.3 mL 10/21/2020 Intramuscular   Manufacturer: [employer], [employer]   Lot: [id number]   NDC: [phone]

## 2021-01-27 ENCOUNTER — Ambulatory Visit: Payer: Medicaid Other | Admitting: Nurse Practitioner

## 2021-03-23 ENCOUNTER — Other Ambulatory Visit: Payer: Self-pay

## 2021-03-24 ENCOUNTER — Encounter: Payer: Self-pay | Admitting: Nurse Practitioner

## 2021-03-24 ENCOUNTER — Ambulatory Visit (INDEPENDENT_AMBULATORY_CARE_PROVIDER_SITE_OTHER): Payer: Medicaid Other | Admitting: Nurse Practitioner

## 2021-03-24 VITALS — BP 112/78 | HR 78 | Temp 97.5°F | Ht 65.0 in | Wt 140.6 lb

## 2021-03-24 DIAGNOSIS — R1013 Epigastric pain: Secondary | ICD-10-CM | POA: Diagnosis not present

## 2021-03-24 DIAGNOSIS — Z0001 Encounter for general adult medical examination with abnormal findings: Secondary | ICD-10-CM | POA: Diagnosis not present

## 2021-03-24 DIAGNOSIS — Z13220 Encounter for screening for lipoid disorders: Secondary | ICD-10-CM

## 2021-03-24 DIAGNOSIS — K5904 Chronic idiopathic constipation: Secondary | ICD-10-CM | POA: Diagnosis not present

## 2021-03-24 DIAGNOSIS — E559 Vitamin D deficiency, unspecified: Secondary | ICD-10-CM

## 2021-03-24 DIAGNOSIS — Z136 Encounter for screening for cardiovascular disorders: Secondary | ICD-10-CM

## 2021-03-24 LAB — COMPREHENSIVE METABOLIC PANEL
ALT: 9 U/L (ref 0–35)
AST: 13 U/L (ref 0–37)
Albumin: 4.4 g/dL (ref 3.5–5.2)
Alkaline Phosphatase: 50 U/L (ref 39–117)
BUN: 10 mg/dL (ref 6–23)
CO2: 29 mEq/L (ref 19–32)
Calcium: 9.3 mg/dL (ref 8.4–10.5)
Chloride: 106 mEq/L (ref 96–112)
Creatinine, Ser: 0.64 mg/dL (ref 0.40–1.20)
GFR: 106.23 mL/min (ref >60.00)
Glucose, Bld: 76 mg/dL (ref 70–99)
Potassium: 4.3 mEq/L (ref 3.5–5.1)
Sodium: 140 mEq/L (ref 135–145)
Total Bilirubin: 0.7 mg/dL (ref 0.2–1.2)
Total Protein: 7 g/dL (ref 6.0–8.3)

## 2021-03-24 LAB — LIPID PANEL
Cholesterol: 206 mg/dL — ABNORMAL HIGH (ref 0–200)
HDL: 65.4 mg/dL (ref >39.00)
LDL Cholesterol: 131 mg/dL — ABNORMAL HIGH (ref 0–99)
NonHDL: 140.32
Total CHOL/HDL Ratio: 3
Triglycerides: 49 mg/dL (ref 0.0–149.0)
VLDL: 9.8 mg/dL (ref 0.0–40.0)

## 2021-03-24 LAB — CBC WITH DIFFERENTIAL/PLATELET
Basophils Absolute: 0 10*3/uL (ref 0.0–0.1)
Basophils Relative: 0.4 % (ref 0.0–3.0)
Eosinophils Absolute: 0.1 10*3/uL (ref 0.0–0.7)
Eosinophils Relative: 1.1 % (ref 0.0–5.0)
HCT: 42.2 % (ref 36.0–46.0)
Hemoglobin: 14.6 g/dL (ref 12.0–15.0)
Lymphocytes Relative: 31.1 % (ref 12.0–46.0)
Lymphs Abs: 1.9 10*3/uL (ref 0.7–4.0)
MCHC: 34.6 g/dL (ref 30.0–36.0)
MCV: 93 fl (ref 78.0–100.0)
Monocytes Absolute: 0.4 10*3/uL (ref 0.1–1.0)
Monocytes Relative: 7.3 % (ref 3.0–12.0)
Neutro Abs: 3.6 10*3/uL (ref 1.4–7.7)
Neutrophils Relative %: 60.1 % (ref 43.0–77.0)
Platelets: 158 10*3/uL (ref 150.0–400.0)
RBC: 4.54 Mil/uL (ref 3.87–5.11)
RDW: 13.7 % (ref 11.5–15.5)
WBC: 6.1 10*3/uL (ref 4.0–10.5)

## 2021-03-24 LAB — TSH: TSH: 1.52 u[IU]/mL (ref 0.35–4.50)

## 2021-03-24 MED ORDER — SENNA-DOCUSATE SODIUM 8.6-50 MG PO TABS: 1.0000 | ORAL_TABLET | Freq: Every day | ORAL | 1 refills | Status: DC

## 2021-03-24 NOTE — Patient Instructions (Signed)
Thank you for choosing [facility].  Go to lab for blood draw and breathe test.  Sign medical release to get records from previous GYN.  Constipation, Adult Constipation is when a person has fewer than three bowel movements in a week, has difficulty having a bowel movement, or has stools (feces) that are dry, hard, or larger than normal. Constipation may be caused by an underlying condition. It may become worse with age if a person takes certain medicines and does not take in enough fluids. Follow these instructions at home: Eating and drinking  Eat foods that have a lot of fiber, such as beans, whole grains, and fresh fruits and vegetables.  Limit foods that are low in fiber and high in fat and processed sugars, such as fried or sweet foods. These include french fries, hamburgers, cookies, candies, and soda.  Drink enough fluid to keep your urine pale yellow.   General instructions  Exercise regularly or as told by your health care provider. Try to do 150 minutes of moderate exercise each week.  Use the bathroom when you have the urge to go. Do not hold it in.  Take over-the-counter and prescription medicines only as told by your health care provider. This includes any fiber supplements.  During bowel movements: ? Practice deep breathing while relaxing the lower abdomen. ? Practice pelvic floor relaxation.  Watch your condition for any changes. Let your health care provider know about them.  Keep all follow-up visits as told by your health care provider. This is important. Contact a health care provider if:  You have pain that gets worse.  You have a fever.  You do not have a bowel movement after 4 days.  You vomit.  You are not hungry or you lose weight.  You are bleeding from the opening between the buttocks (anus).  You have thin, pencil-like stools. Get help right away if:  You have a fever and your symptoms suddenly get worse.  You leak stool or have  blood in your stool.  Your abdomen is bloated.  You have severe pain in your abdomen.  You feel dizzy or you faint. Summary  Constipation is when a person has fewer than three bowel movements in a week, has difficulty having a bowel movement, or has stools (feces) that are dry, hard, or larger than normal.  Eat foods that have a lot of fiber, such as beans, whole grains, and fresh fruits and vegetables.  Drink enough fluid to keep your urine pale yellow.  Take over-the-counter and prescription medicines only as told by your health care provider. This includes any fiber supplements. This information is not intended to replace advice given to you by your health care provider. Make sure you discuss any questions you have with your health care provider. Document Revised: 09/17/2019 Document Reviewed: 09/17/2019 Elsevier Patient Education  2021 [employer].

## 2021-03-24 NOTE — Progress Notes (Signed)
Subjective:    Patient ID: Sara Krueger, female    DOB: 11-Jul-1976, 45 y.o.   MRN: [id number]  Patient presents today for CPE and eval of chronic conditions  GI Problem The primary symptoms include abdominal pain and nausea. Primary symptoms do not include fever, weight loss, fatigue, vomiting, diarrhea, melena, hematemesis, jaundice, hematochezia, dysuria, myalgias, arthralgias or rash. The illness began more than 7 days ago (ongoing for several years). The onset was sudden.  The illness is also significant for bloating and constipation. The illness does not include chills, anorexia, dysphagia, odynophagia, tenesmus, back pain or itching. Associated medical issues do not include inflammatory bowel disease, GERD, gallstones, liver disease, alcohol abuse, PUD, gastric bypass, bowel resection, irritable bowel syndrome, hemorrhoids or diverticulitis.  no improvement with miralax.  Sexual History (orientation,birth control, marital status, STD):denies need for STD screen, s/p tubal ligation, deferred breast and pelvic exam to GYN.  Depression/Suicide: Depression screen [facility] 2/9 03/25/2021 06/07/2016  Decreased Interest 1 0  Down, Depressed, Hopeless 0 0  PHQ - 2 Score 1 0  Altered sleeping 3 1  Tired, decreased energy 3 3  Change in appetite 0 3  Feeling bad or failure about yourself  0 0  Trouble concentrating 0 2  Moving slowly or fidgety/restless 0 0  Suicidal thoughts 0 0  PHQ-9 Score 7 9  Difficult doing work/chores Not difficult at all -   Vision:up to date  Dental:up to date  Immunizations: (TDAP, Hep C screen, Pneumovax, Influenza, zoster)  Health Maintenance  Topic Date Due  . Colon Cancer Screening  Never done  . COVID-19 Vaccine (3 - Booster for Pfizer series) 06/07/2021  . Flu Shot  06/13/2021  . Pap Smear  12/07/2022  . Tetanus Vaccine  07/02/2028  . Hepatitis C Screening: USPSTF Recommendation to screen - Ages 74-79 yo.  Completed  . HIV Screening  Completed  .  HPV Vaccine  Aged Out   Diet:regular Exercise: running and yoga Weight:  Wt Readings from Last 3 Encounters:  03/24/21 140 lb 9.6 oz (63.8 kg)  03/30/20 138 lb (62.6 kg)  02/19/20 134 lb (60.8 kg)   Fall Risk: Fall Risk  03/24/2021  Falls in the past year? 0  Number falls in past yr: 0  Injury with Fall? 0  Risk for fall due to : No Fall Risks  Follow up Falls evaluation completed   Medications and allergies reviewed with patient and updated if appropriate.  Patient Active Problem List   Diagnosis Date Noted  . Chronic idiopathic constipation 03/25/2021  . Vitamin D insufficiency 03/25/2021  . Abnormal uterine bleeding (AUB) 03/30/2020    No current outpatient medications on file prior to visit.   No current facility-administered medications on file prior to visit.    Past Medical History:  Diagnosis Date  . Depression    no meds  . SVD (spontaneous vaginal delivery)    x 4    Past Surgical History:  Procedure Laterality Date  . LAPAROSCOPIC TUBAL LIGATION Bilateral 05/21/2018   Procedure: LAPAROSCOPIC TUBAL LIGATION;  Surgeon: Allie Bossier, MD;  Location: [facility];  Service: Gynecology;  Laterality: Bilateral;  with filshie Clips   . LAPAROSCOPY N/A 05/21/2018   Procedure: LAPAROSCOPY DIAGNOSTIC;  Surgeon: Allie Bossier, MD;  Location: [facility];  Service: Gynecology;  Laterality: N/A;  removal of two filshe clips in abdomen from a previous procedure   . TUBAL LIGATION  2006   Failed Tubal    Social History  Socioeconomic History  . Marital status: Single    Spouse name: Not on file  . Number of children: Not on file  . Years of education: Not on file  . Highest education level: Not on file  Occupational History  . Not on file  Tobacco Use  . Smoking status: Never Smoker  . Smokeless tobacco: Never Used  Vaping Use  . Vaping Use: Never used  Substance and Sexual Activity  . Alcohol use: No  . Drug use: No  . Sexual activity: Yes    Birth control/protection:  Surgical    Comment: tubal ligation  Other Topics Concern  . Not on file  Social History Narrative  . Not on file   Social Determinants of Health   Financial Resource Strain: Not on file  Food Insecurity: Not on file  Transportation Needs: Not on file  Physical Activity: Not on file  Stress: Not on file  Social Connections: Not on file   Family History  Problem Relation Age of Onset  . Breast cancer Paternal Aunt 5  . Heart disease Father   . Hyperlipidemia Father        Review of Systems  Constitutional: Negative for chills, fatigue, fever and weight loss.  Gastrointestinal: Positive for abdominal pain, bloating, constipation and nausea. Negative for anorexia, diarrhea, dysphagia, hematemesis, hematochezia, jaundice, melena and vomiting.  Genitourinary: Negative for dysuria.  Musculoskeletal: Negative for arthralgias, back pain and myalgias.  Skin: Negative for itching and rash.    Objective:   Vitals:   03/24/21 1041  BP: 112/78  Pulse: 78  Temp: (!) 97.5 F (36.4 C)  SpO2: 98%   Body mass index is 23.4 kg/m.  Physical Examination:  Physical Exam Vitals reviewed.  Constitutional:      General: She is not in acute distress. HENT:     Right Ear: Tympanic membrane, ear canal and external ear normal.     Left Ear: Tympanic membrane, ear canal and external ear normal.  Eyes:     General: No scleral icterus.    Extraocular Movements: Extraocular movements intact.     Conjunctiva/sclera: Conjunctivae normal.  Neck:     Thyroid: No thyromegaly.  Cardiovascular:     Rate and Rhythm: Normal rate and regular rhythm.     Pulses: Normal pulses.     Heart sounds: Normal heart sounds.  Pulmonary:     Effort: Pulmonary effort is normal.     Breath sounds: Normal breath sounds.  Chest:     Chest wall: No tenderness.  Abdominal:     General: Bowel sounds are normal. There is no distension.     Palpations: Abdomen is soft.     Tenderness: There is no abdominal  tenderness.  Genitourinary:    Comments: Deferred to GYN Musculoskeletal:        General: No tenderness. Normal range of motion.     Cervical back: Normal range of motion and neck supple.  Lymphadenopathy:     Cervical: No cervical adenopathy.  Skin:    General: Skin is warm and dry.  Neurological:     Mental Status: She is alert and oriented to person, place, and time.  Psychiatric:        Mood and Affect: Mood normal.        Behavior: Behavior normal.        Thought Content: Thought content normal.        Judgment: Judgment normal.    ASSESSMENT and PLAN: This visit occurred during  the SARS-CoV-2 public health emergency.  Safety protocols were in place, including screening questions prior to the visit, additional usage of staff PPE, and extensive cleaning of exam room while observing appropriate contact time as indicated for disinfecting solutions.   Allice was seen today for establish care.  Diagnoses and all orders for this visit:  Encounter for preventative adult health care exam with abnormal findings -     CBC with Differential/Platelet -     Comprehensive metabolic panel -     Lipid panel -     TSH  Encounter for lipid screening for cardiovascular disease -     Lipid panel  Chronic idiopathic constipation -     sennosides-docusate sodium (SENOKOT-S) 8.6-50 MG tablet; Take 1 tablet by mouth daily.  Dyspepsia -     H. pylori breath test  Vitamin D insufficiency -     Vitamin D 1,25 dihydroxy      Problem List Items Addressed This Visit      Digestive   Chronic idiopathic constipation   Relevant Medications   sennosides-docusate sodium (SENOKOT-S) 8.6-50 MG tablet     Other   Vitamin D insufficiency   Relevant Orders   Vitamin D 1,25 dihydroxy    Other Visit Diagnoses    Encounter for preventative adult health care exam with abnormal findings    -  Primary   Relevant Orders   CBC with Differential/Platelet (Completed)   Comprehensive metabolic  panel (Completed)   Lipid panel (Completed)   TSH (Completed)   Encounter for lipid screening for cardiovascular disease       Relevant Orders   Lipid panel (Completed)   Dyspepsia       Relevant Orders   H. pylori breath test (Completed)      Follow up: Return in about 1 year (around 03/24/2022) for CPE (fasting).  Alysia Penna, NP

## 2021-03-25 ENCOUNTER — Encounter: Payer: Self-pay | Admitting: Nurse Practitioner

## 2021-03-25 DIAGNOSIS — K5904 Chronic idiopathic constipation: Secondary | ICD-10-CM | POA: Insufficient documentation

## 2021-03-25 DIAGNOSIS — E559 Vitamin D deficiency, unspecified: Secondary | ICD-10-CM | POA: Insufficient documentation

## 2021-03-25 HISTORY — DX: Chronic idiopathic constipation: K59.04

## 2021-03-25 HISTORY — DX: Vitamin D deficiency, unspecified: E55.9

## 2021-03-28 LAB — H. PYLORI BREATH TEST: H. pylori Breath Test: NOT DETECTED

## 2021-03-28 LAB — VITAMIN D 1,25 DIHYDROXY
Vitamin D 1, 25 (OH)2 Total: 43 pg/mL (ref 18–72)
Vitamin D2 1, 25 (OH)2: <8 pg/mL
Vitamin D3 1, 25 (OH)2: 43 pg/mL

## 2021-04-27 ENCOUNTER — Encounter: Payer: Self-pay | Admitting: Nurse Practitioner

## 2021-06-07 ENCOUNTER — Telehealth: Payer: Medicaid Other | Admitting: Nurse Practitioner

## 2021-07-01 ENCOUNTER — Other Ambulatory Visit: Payer: Self-pay

## 2021-07-01 ENCOUNTER — Ambulatory Visit (INDEPENDENT_AMBULATORY_CARE_PROVIDER_SITE_OTHER): Payer: Medicaid Other | Admitting: Nurse Practitioner

## 2021-07-01 ENCOUNTER — Encounter: Payer: Self-pay | Admitting: Nurse Practitioner

## 2021-07-01 VITALS — BP 100/70 | HR 88 | Temp 97.8°F | Ht 65.0 in | Wt 133.0 lb

## 2021-07-01 DIAGNOSIS — R400 Somnolence: Secondary | ICD-10-CM | POA: Diagnosis not present

## 2021-07-01 NOTE — Progress Notes (Signed)
Subjective:  Patient ID: Sara Krueger, female    DOB: November 16, 1975  Age: 45 y.o. MRN: [id number]  CC: Acute Visit (Pt state she was experiencing left ear pain x 3 days, pt states the pain is now gone as of this morning. Pt states the only other thing she has noticed is she has been very fatigued. )  HPI [name] states she is concerned about persistent daytime somnolence and fatigue. Ongoing for several years and no change. She is concerned this might be due to elevated LDL. Fatigue is worse after a heavy meal. She does not fall asleep while watch TV or reading or during a conversation or sitting still or as a passenger. She denies any AM headache or LE edema or abnormal weight gain. She is unable to take a nap mid day due to her  work schedule, but struggles during the day to stay focused. Bedtime in 10:30pm but falls asleep at . Wakes at 5:30am. She has hx of depression but denies needs for medication or therapy at this time. She has no hx of COVID infection or anemia. No Fhx of OSA.  Depression screen [facility] 2/9 03/25/2021 06/07/2016  Decreased Interest 1 0  Down, Depressed, Hopeless 0 0  PHQ - 2 Score 1 0  Altered sleeping 3 1  Tired, decreased energy 3 3  Change in appetite 0 3  Feeling bad or failure about yourself  0 0  Trouble concentrating 0 2  Moving slowly or fidgety/restless 0 0  Suicidal thoughts 0 0  PHQ-9 Score 7 9  Difficult doing work/chores Not difficult at all -    GAD 7 : Generalized Anxiety Score 03/25/2021 06/07/2016  Nervous, Anxious, on Edge 0 0  Control/stop worrying 0 0  Worry too much - different things 1 0  Trouble relaxing 2 1  Restless 0 0  Easily annoyed or irritable 0 0  Afraid - awful might happen 0 0  Total GAD 7 Score 3 1  Anxiety Difficulty Somewhat difficult -    BP Readings from Last 3 Encounters:  07/01/21 100/70  03/24/21 112/78  03/30/20 121/78    Wt Readings from Last 3 Encounters:  07/01/21 133 lb (60.3 kg)  03/24/21 140  lb 9.6 oz (63.8 kg)  03/30/20 138 lb (62.6 kg)    Reviewed past Medical, Social and Family history today.  Outpatient Medications Prior to Visit  Medication Sig Dispense Refill   sennosides-docusate sodium (SENOKOT-S) 8.6-50 MG tablet Take 1 tablet by mouth daily. (Patient not taking: Reported on 07/01/2021) 30 tablet 1   No facility-administered medications prior to visit.    ROS See HPI  Objective:  BP 100/70 (BP Location: Left Arm, Patient Position: Sitting, Cuff Size: Large)   Pulse 88   Temp 97.8 F (36.6 C) (Temporal)   Ht 5\' 5"  (1.651 m)   Wt 133 lb (60.3 kg)   SpO2 98%   BMI 22.13 kg/m   Physical Exam Cardiovascular:     Rate and Rhythm: Normal rate.     Pulses: Normal pulses.  Pulmonary:     Effort: Pulmonary effort is normal.  Musculoskeletal:     Right lower leg: No edema.     Left lower leg: No edema.  Neurological:     Mental Status: She is alert and oriented to person, place, and time.  Psychiatric:        Mood and Affect: Mood normal.        Behavior: Behavior normal.  Thought Content: Thought content normal.   Assessment & Plan:  This visit occurred during the SARS-CoV-2 public health emergency.  Safety protocols were in place, including screening questions prior to the visit, additional usage of staff PPE, and extensive cleaning of exam room while observing appropriate contact time as indicated for disinfecting solutions.   Forest was seen today for acute visit.  Diagnoses and all orders for this visit:  Daytime somnolence -     Ambulatory referral to Sleep Studies I explained the lack of correlation between elevated LDL and daytime somnolence. We also reviewed recent labs results (CBC, TSH, vitamin D and CMP): no metabolic reason for symptoms. She is at low risk for OSA. Advised to use melatonin or benadryl to improve  sleep onset. Provided information on good sleep hygiene.  Problem List Items Addressed This Visit   None Visit Diagnoses      Daytime somnolence    -  Primary   Relevant Orders   Ambulatory referral to Sleep Studies       Follow-up: Return if symptoms worsen or fail to improve.  Alysia Penna, NP

## 2021-07-01 NOTE — Patient Instructions (Addendum)
You will be contacted to schedule appt with neurology (sleep specialist).  Insomnia Insomnia is a sleep disorder that makes it difficult to fall asleep or stay asleep. Insomnia can cause fatigue, low energy, difficulty concentrating, moodswings, and poor performance at work or school. There are three different ways to classify insomnia: Difficulty falling asleep. Difficulty staying asleep. Waking up too early in the morning. Any type of insomnia can be long-term (chronic) or short-term (acute). Both are common. Short-term insomnia usually lasts for three months or less. Chronic insomnia occurs at least three times a week for longer than threemonths. What are the causes? Insomnia may be caused by another condition, situation, or substance, such as: Anxiety. Certain medicines. Gastroesophageal reflux disease (GERD) or other gastrointestinal conditions. Asthma or other breathing conditions. Restless legs syndrome, sleep apnea, or other sleep disorders. Chronic pain. Menopause. Stroke. Abuse of alcohol, tobacco, or illegal drugs. Mental health conditions, such as depression. Caffeine. Neurological disorders, such as Alzheimer's disease. An overactive thyroid (hyperthyroidism). Sometimes, the cause of insomnia may not be known. What increases the risk? Risk factors for insomnia include: Gender. Women are affected more often than men. Age. Insomnia is more common as you get older. Stress. Lack of exercise. Irregular work schedule or working night shifts. Traveling between different time zones. Certain medical and mental health conditions. What are the signs or symptoms? If you have insomnia, the main symptom is having trouble falling asleep or having trouble staying asleep. This may lead to other symptoms, such as: Feeling fatigued or having low energy. Feeling nervous about going to sleep. Not feeling rested in the morning. Having trouble concentrating. Feeling irritable, anxious,  or depressed. How is this diagnosed? This condition may be diagnosed based on: Your symptoms and medical history. Your health care provider may ask about: Your sleep habits. Any medical conditions you have. Your mental health. A physical exam. How is this treated? Treatment for insomnia depends on the cause. Treatment may focus on treating an underlying condition that is causing insomnia. Treatment may also include: Medicines to help you sleep. Counseling or therapy. Lifestyle adjustments to help you sleep better. Follow these instructions at home: Eating and drinking  Limit or avoid alcohol, caffeinated beverages, and cigarettes, especially close to bedtime. These can disrupt your sleep. Do not eat a large meal or eat spicy foods right before bedtime. This can lead to digestive discomfort that can make it hard for you to sleep.  Sleep habits  Keep a sleep diary to help you and your health care provider figure out what could be causing your insomnia. Write down: When you sleep. When you wake up during the night. How well you sleep. How rested you feel the next day. Any side effects of medicines you are taking. What you eat and drink. Make your bedroom a dark, comfortable place where it is easy to fall asleep. Put up shades or blackout curtains to block light from outside. Use a white noise machine to block noise. Keep the temperature cool. Limit screen use before bedtime. This includes: Watching TV. Using your smartphone, tablet, or computer. Stick to a routine that includes going to bed and waking up at the same times every day and night. This can help you fall asleep faster. Consider making a quiet activity, such as reading, part of your nighttime routine. Try to avoid taking naps during the day so that you sleep better at night. Get out of bed if you are still awake after 15 minutes of trying to  sleep. Keep the lights down, but try reading or doing a quiet activity. When you  feel sleepy, go back to bed.  General instructions Take over-the-counter and prescription medicines only as told by your health care provider. Exercise regularly, as told by your health care provider. Avoid exercise starting several hours before bedtime. Use relaxation techniques to manage stress. Ask your health care provider to suggest some techniques that may work well for you. These may include: Breathing exercises. Routines to release muscle tension. Visualizing peaceful scenes. Make sure that you drive carefully. Avoid driving if you feel very sleepy. Keep all follow-up visits as told by your health care provider. This is important. Contact a health care provider if: You are tired throughout the day. You have trouble in your daily routine due to sleepiness. You continue to have sleep problems, or your sleep problems get worse. Get help right away if: You have serious thoughts about hurting yourself or someone else. If you ever feel like you may hurt yourself or others, or have thoughts about taking your own life, get help right away. You can go to your nearest emergency department or call: Your local emergency services (911 in the [place] A suicide crisis helpline, such as the National Suicide Prevention Lifeline at (614)588-3451. This is open 24 hours a day. Summary Insomnia is a sleep disorder that makes it difficult to fall asleep or stay asleep. Insomnia can be long-term (chronic) or short-term (acute). Treatment for insomnia depends on the cause. Treatment may focus on treating an underlying condition that is causing insomnia. Keep a sleep diary to help you and your health care provider figure out what could be causing your insomnia. This information is not intended to replace advice given to you by your health care provider. Make sure you discuss any questions you have with your healthcare provider. Document Revised: 09/09/2020 Document Reviewed: 09/09/2020 Elsevier Patient  Education  2022 [employer].

## 2021-07-12 ENCOUNTER — Institutional Professional Consult (permissible substitution): Payer: Medicaid Other | Admitting: Plastic Surgery

## 2021-10-18 ENCOUNTER — Telehealth: Payer: Medicaid Other | Admitting: Nurse Practitioner

## 2021-11-25 ENCOUNTER — Encounter: Payer: Self-pay | Admitting: Nurse Practitioner

## 2021-11-29 ENCOUNTER — Ambulatory Visit: Payer: Medicaid Other | Admitting: Obstetrics & Gynecology

## 2022-01-17 ENCOUNTER — Ambulatory Visit (INDEPENDENT_AMBULATORY_CARE_PROVIDER_SITE_OTHER): Payer: Medicaid Other | Admitting: Obstetrics & Gynecology

## 2022-01-17 ENCOUNTER — Encounter: Payer: Self-pay | Admitting: Obstetrics & Gynecology

## 2022-01-17 ENCOUNTER — Other Ambulatory Visit: Payer: Self-pay

## 2022-01-17 VITALS — BP 115/76 | HR 82 | Wt 135.0 lb

## 2022-01-17 DIAGNOSIS — Z1211 Encounter for screening for malignant neoplasm of colon: Secondary | ICD-10-CM | POA: Diagnosis not present

## 2022-01-17 DIAGNOSIS — Z1231 Encounter for screening mammogram for malignant neoplasm of breast: Secondary | ICD-10-CM

## 2022-01-17 NOTE — Progress Notes (Signed)
? ?  GYNECOLOGY OFFICE VISIT NOTE ? ?History:  ? Sara Krueger is a 46 y.o. [id number] here today for pelvic pain and vaginal discharge but she reports that both have resolved.  No other concerns but wants to make sure she is up to date with everything needed. She denies any abnormal vaginal discharge, bleeding, pelvic pain or other concerns.  ?  ?Past Medical History:  ?Diagnosis Date  ? Depression   ? no meds  ? SVD (spontaneous vaginal delivery)   ? x 4  ? ? ?Past Surgical History:  ?Procedure Laterality Date  ? LAPAROSCOPIC TUBAL LIGATION Bilateral 05/21/2018  ? Procedure: LAPAROSCOPIC TUBAL LIGATION;  Surgeon: Allie Bossier, MD;  Location: [facility];  Service: Gynecology;  Laterality: Bilateral;  with filshie Clips   ? LAPAROSCOPY N/A 05/21/2018  ? Procedure: LAPAROSCOPY DIAGNOSTIC;  Surgeon: Allie Bossier, MD;  Location: [facility];  Service: Gynecology;  Laterality: N/A;  removal of two filshe clips in abdomen from a previous procedure   ? TUBAL LIGATION  2006  ? Failed Tubal  ? ? ?The following portions of the patient's history were reviewed and updated as appropriate: allergies, current medications, past family history, past medical history, past social history, past surgical history and problem list.  ? ?Health Maintenance:  Normal pap and negative HRHPV on 12/08/2019.  Normal mammogram on 01/27/2020.  ? ?Review of Systems:  ?Pertinent items noted in HPI and remainder of comprehensive ROS otherwise negative. ? ?Physical Exam:  ?BP 115/76   Pulse 82   Wt 135 lb (61.2 kg)   LMP 12/30/2021 (Exact Date)   BMI 22.47 kg/m?  ?CONSTITUTIONAL: Well-developed, well-nourished female in no acute distress.  ?HEENT:  Normocephalic, atraumatic. External right and left ear normal. No scleral icterus.  ?NECK: Normal range of motion, supple, no masses noted on observation ?SKIN: No rash noted. Not diaphoretic. No erythema. No pallor. ?MUSCULOSKELETAL: Normal range of motion. No edema noted. ?NEUROLOGIC: Alert and oriented to person,  place, and time. Normal muscle tone coordination. No cranial nerve deficit noted. ?PSYCHIATRIC: Normal mood and affect. Normal behavior. Normal judgment and thought content. ?CARDIOVASCULAR: Normal heart rate noted ?RESPIRATORY: Effort and breath sounds normal, no problems with respiration noted ?ABDOMEN: No masses noted. No other overt distention noted.   ?PELVIC: Deferred ?    ?Assessment and Plan:  ?  1. Breast cancer screening by mammogram ?Mammogram scheduled for patient. ?- MM 3D SCREEN BREAST BILATERAL; Future ? ?2. Colon cancer screening ?Recommended colon cancer screening, she agreed. Referred to GI. ?- Ambulatory referral to Gastroenterology ?Routine preventative health maintenance measures emphasized. ?Please refer to After Visit Summary for other counseling recommendations.  ? ?Return in about 11 months (around 12/06/2022) for Pap smear or earlier for any gynecologic concerns.   ? ?I spent 20 minutes dedicated to the care of this patient including pre-visit review of records, face to face time with the patient discussing her conditions and treatments and post visit orders. ? ? ? ?Jaynie Collins, MD, FACOG ?Obstetrician Heritage manager, Faculty Practice ?Center for [employer], [facility] ? ? ? ? ? ? ?

## 2022-01-20 ENCOUNTER — Ambulatory Visit
Admission: RE | Admit: 2022-01-20 | Discharge: 2022-01-20 | Disposition: A | Discharge status: Home / Self Care | Payer: Medicaid Other | Source: Ambulatory Visit | Attending: Obstetrics & Gynecology | Admitting: Obstetrics & Gynecology

## 2022-01-20 DIAGNOSIS — Z1231 Encounter for screening mammogram for malignant neoplasm of breast: Secondary | ICD-10-CM

## 2022-02-03 ENCOUNTER — Encounter: Payer: Self-pay | Admitting: Nurse Practitioner

## 2022-02-03 ENCOUNTER — Ambulatory Visit (INDEPENDENT_AMBULATORY_CARE_PROVIDER_SITE_OTHER): Payer: Medicaid Other | Admitting: Nurse Practitioner

## 2022-02-03 VITALS — BP 98/60 | HR 86 | Temp 97.8°F | Ht 65.0 in | Wt 134.8 lb

## 2022-02-03 DIAGNOSIS — K5904 Chronic idiopathic constipation: Secondary | ICD-10-CM | POA: Diagnosis not present

## 2022-02-03 DIAGNOSIS — R202 Paresthesia of skin: Secondary | ICD-10-CM

## 2022-02-03 DIAGNOSIS — E559 Vitamin D deficiency, unspecified: Secondary | ICD-10-CM

## 2022-02-03 DIAGNOSIS — Z1211 Encounter for screening for malignant neoplasm of colon: Secondary | ICD-10-CM | POA: Diagnosis not present

## 2022-02-03 LAB — CBC
HCT: 42.2 % (ref 36.0–46.0)
Hemoglobin: 14.5 g/dL (ref 12.0–15.0)
MCHC: 34.3 g/dL (ref 30.0–36.0)
MCV: 95.2 fl (ref 78.0–100.0)
Platelets: 166 10*3/uL (ref 150.0–400.0)
RBC: 4.43 Mil/uL (ref 3.87–5.11)
RDW: 13.3 % (ref 11.5–15.5)
WBC: 4.3 10*3/uL (ref 4.0–10.5)

## 2022-02-03 LAB — COMPREHENSIVE METABOLIC PANEL
ALT: 9 U/L (ref 0–35)
AST: 17 U/L (ref 0–37)
Albumin: 4.4 g/dL (ref 3.5–5.2)
Alkaline Phosphatase: 52 U/L (ref 39–117)
BUN: 8 mg/dL (ref 6–23)
CO2: 31 mEq/L (ref 19–32)
Calcium: 8.8 mg/dL (ref 8.4–10.5)
Chloride: 107 mEq/L (ref 96–112)
Creatinine, Ser: 0.65 mg/dL (ref 0.40–1.20)
GFR: 105.19 mL/min (ref >60.00)
Glucose, Bld: 94 mg/dL (ref 70–99)
Potassium: 3.8 mEq/L (ref 3.5–5.1)
Sodium: 141 mEq/L (ref 135–145)
Total Bilirubin: 0.9 mg/dL (ref 0.2–1.2)
Total Protein: 6.8 g/dL (ref 6.0–8.3)

## 2022-02-03 MED ORDER — DOCUSATE SODIUM 100 MG PO CAPS: 100.0000 mg | ORAL_CAPSULE | Freq: Two times a day (BID) | ORAL | 0 refills | Status: DC

## 2022-02-03 NOTE — Assessment & Plan Note (Signed)
Chronic, no change, BM 1-2x/week. Associated with bloating ?Has BM daily with Use of senokot daily, but this causes ABD cramps. ?No blood in stool ?No previous Colonoscopy. ? ?Advised to stop senokot. Start colace 100mg  BID. Maintain high fiber diet, adequate oral hydration. ?Entered referral to GI for colonoscopy ?

## 2022-02-03 NOTE — Progress Notes (Signed)
? ?             Established Patient Visit ? ?Patient: Sara Krueger   DOB: May 21, 1976   45 y.o. Female  MRN: [id number] ?Visit Date: 02/07/2022 ? ?Subjective:  ?  ?Chief Complaint  ?Patient presents with  ? Acute Visit  ?  Pt c/o itching sensations on the left side of her body, pt states it feels like she is itching from the inside x 6 months.  Pt states it feels cold/numb on that side of her body also x 2 weeks  ? ?HPI ?Chronic idiopathic constipation ?Chronic, no change, BM 1-2x/week. Associated with bloating ?Has BM daily with Use of senokot daily, but this causes ABD cramps. ?No blood in stool ?No previous Colonoscopy. ? ?Advised to stop senokot. Start colace 100mg  BID. Maintain high fiber diet, adequate oral hydration. ?Entered referral to GI for colonoscopy ? ?She describes a sensation of Itching on left side of body x 25months, constant, does not impair daily function or sleep. ?She has also noticed pain in feet and cold toes x 2weeks, does not interfere with walking or sleeping. ?No tobacco use, no ETOH use, no marijuana use. ? ?Wt Readings from Last 3 Encounters:  ?02/03/22 134 lb 12.8 oz (61.1 kg)  ?01/17/22 135 lb (61.2 kg)  ?07/01/21 133 lb (60.3 kg)  ?  ?Reviewed medical, surgical, and social history today ? ?Medications: ?No outpatient medications prior to visit.  ? ?No facility-administered medications prior to visit.  ? ?Reviewed past medical and social history.  ? ?ROS per HPI above ? ? ?   ?Objective:  ?BP 98/60 (BP Location: Left Arm, Patient Position: Sitting, Cuff Size: Normal)   Pulse 86   Temp 97.8 ?F (36.6 ?C) (Temporal)   Ht 5\' 5"  (1.651 m)   Wt 134 lb 12.8 oz (61.1 kg)   SpO2 97%   BMI 22.43 kg/m?  ? ?  ? ?Physical Exam ?Cardiovascular:  ?   Rate and Rhythm: Normal rate and regular rhythm.  ?   Pulses: Normal pulses.  ?   Heart sounds: Normal heart sounds.  ?Pulmonary:  ?   Effort: Pulmonary effort is normal.  ?   Breath sounds: Normal breath sounds.  ?Abdominal:  ?   General:  Bowel sounds are normal. There is no distension.  ?   Palpations: Abdomen is soft.  ?   Tenderness: There is no abdominal tenderness. There is no guarding.  ?Musculoskeletal:     ?   General: No swelling or tenderness. Normal range of motion.  ?   Right lower leg: No edema.  ?   Left lower leg: No edema.  ?Skin: ?   General: Skin is warm and dry.  ?   Findings: No erythema or rash.  ?Neurological:  ?   Mental Status: She is alert and oriented to person, place, and time.  ?   Cranial Nerves: No cranial nerve deficit.  ?   Motor: Motor function is intact.  ?   Gait: Gait is intact.  ?   Deep Tendon Reflexes:  ?   Reflex Scores: ?     Bicep reflexes are 2+ on the right side and 2+ on the left side. ?     Patellar reflexes are 2+ on the right side and 2+ on the left side. ?Psychiatric:     ?   Mood and Affect: Mood normal.     ?   Behavior: Behavior normal.  ?  ?Results for  orders placed or performed in visit on 02/03/22  ?CBC  ?Result Value Ref Range  ? WBC 4.3 4.0 - 10.5 K/uL  ? RBC 4.43 3.87 - 5.11 Mil/uL  ? Platelets 166.0 150.0 - 400.0 K/uL  ? Hemoglobin 14.5 12.0 - 15.0 g/dL  ? HCT 42.2 36.0 - 46.0 %  ? MCV 95.2 78.0 - 100.0 fl  ? MCHC 34.3 30.0 - 36.0 g/dL  ? RDW 13.3 11.5 - 15.5 %  ?Iron, TIBC and Ferritin Panel  ?Result Value Ref Range  ? Iron 144 40 - 190 mcg/dL  ? TIBC 304 250 - 450 mcg/dL (calc)  ? %SAT 47 (H) 16 - 45 % (calc)  ? Ferritin 35 16 - 232 ng/mL  ?TSH  ?Result Value Ref Range  ? TSH 1.52 0.35 - 5.50 uIU/mL  ?T4, free  ?Result Value Ref Range  ? Free T4 0.93 0.60 - 1.60 ng/dL  ?B12  ?Result Value Ref Range  ? Vitamin B-12 518 211 - 911 pg/mL  ?Vitamin D (25 hydroxy)  ?Result Value Ref Range  ? VITD 20.84 (L) 30.00 - 100.00 ng/mL  ?Comprehensive metabolic panel  ?Result Value Ref Range  ? Sodium 141 135 - 145 mEq/L  ? Potassium 3.8 3.5 - 5.1 mEq/L  ? Chloride 107 96 - 112 mEq/L  ? CO2 31 19 - 32 mEq/L  ? Glucose, Bld 94 70 - 99 mg/dL  ? BUN 8 6 - 23 mg/dL  ? Creatinine, Ser 0.65 0.40 - 1.20 mg/dL  ?  Total Bilirubin 0.9 0.2 - 1.2 mg/dL  ? Alkaline Phosphatase 52 39 - 117 U/L  ? AST 17 0 - 37 U/L  ? ALT 9 0 - 35 U/L  ? Total Protein 6.8 6.0 - 8.3 g/dL  ? Albumin 4.4 3.5 - 5.2 g/dL  ? GFR 105.19 >60.00 mL/min  ? Calcium 8.8 8.4 - 10.5 mg/dL  ? ?   ?Assessment & Plan:  ?  ?Problem List Items Addressed This Visit   ? ?  ? Digestive  ? Chronic idiopathic constipation  ?  Chronic, no change, BM 1-2x/week. Associated with bloating ?Has BM daily with Use of senokot daily, but this causes ABD cramps. ?No blood in stool ?No previous Colonoscopy. ? ?Advised to stop senokot. Start colace 100mg  BID. Maintain high fiber diet, adequate oral hydration. ?Entered referral to GI for colonoscopy ?  ?  ? Relevant Medications  ? docusate sodium (COLACE) 100 MG capsule  ?  ? Other  ? Vitamin D insufficiency  ? Relevant Medications  ? Vitamin D, Ergocalciferol, (DRISDOL) 1.25 MG (50000 UNIT) CAPS capsule  ? ?Other Visit Diagnoses   ? ? Paresthesia    -  Primary  ? Relevant Orders  ? CBC (Completed)  ? Iron, TIBC and Ferritin Panel (Completed)  ? TSH (Completed)  ? T4, free (Completed)  ? B12 (Completed)  ? Vitamin D (25 hydroxy) (Completed)  ? Comprehensive metabolic panel (Completed)  ? Colon cancer screening      ? Relevant Orders  ? Ambulatory referral to Gastroenterology  ? ?  ? ?Return in about 2 months (around 04/05/2022) for CPE (fasting). ? ?  ? ?04/07/2022, NP ? ? ?

## 2022-02-03 NOTE — Patient Instructions (Signed)
Go to lab for blood draw ? ?Use colace 100mg  1-2x/day for constipation ?Maintain high fiber diet and adequate oral hydration. ? ?You will be contacted to schedule appt to GI. ? ?

## 2022-02-04 LAB — IRON,TIBC AND FERRITIN PANEL
%SAT: 47 % (calc) — ABNORMAL HIGH (ref 16–45)
Ferritin: 35 ng/mL (ref 16–232)
Iron: 144 ug/dL (ref 40–190)
TIBC: 304 mcg/dL (calc) (ref 250–450)

## 2022-02-06 ENCOUNTER — Encounter: Payer: Self-pay | Admitting: Nurse Practitioner

## 2022-02-07 LAB — TSH: TSH: 1.52 u[IU]/mL (ref 0.35–5.50)

## 2022-02-07 LAB — VITAMIN D 25 HYDROXY (VIT D DEFICIENCY, FRACTURES): VITD: 20.84 ng/mL — ABNORMAL LOW (ref 30.00–100.00)

## 2022-02-07 LAB — T4, FREE: Free T4: 0.93 ng/dL (ref 0.60–1.60)

## 2022-02-07 LAB — VITAMIN B12: Vitamin B-12: 518 pg/mL (ref 211–911)

## 2022-02-07 MED ORDER — VITAMIN D (ERGOCALCIFEROL) 1.25 MG (50000 UNIT) PO CAPS: 50000.0000 [IU] | ORAL_CAPSULE | ORAL | 0 refills | Status: DC

## 2022-02-08 ENCOUNTER — Encounter: Payer: Self-pay | Admitting: Internal Medicine

## 2022-02-14 ENCOUNTER — Encounter: Payer: Self-pay | Admitting: Internal Medicine

## 2022-02-14 ENCOUNTER — Ambulatory Visit (INDEPENDENT_AMBULATORY_CARE_PROVIDER_SITE_OTHER): Payer: Medicaid Other | Admitting: Internal Medicine

## 2022-02-14 VITALS — BP 110/64 | HR 79 | Ht 65.0 in | Wt 137.4 lb

## 2022-02-14 DIAGNOSIS — Z1211 Encounter for screening for malignant neoplasm of colon: Secondary | ICD-10-CM

## 2022-02-14 DIAGNOSIS — K5900 Constipation, unspecified: Secondary | ICD-10-CM | POA: Diagnosis not present

## 2022-02-14 MED ORDER — HYDROCORTISONE (PERIANAL) 2.5 % EX CREA: 1.0000 "application " | TOPICAL_CREAM | Freq: Two times a day (BID) | CUTANEOUS | 1 refills | Status: DC

## 2022-02-14 MED ORDER — NA SULFATE-K SULFATE-MG SULF 17.5-3.13-1.6 GM/177ML PO SOLN: 1.0000 | Freq: Once | ORAL | 0 refills | Status: AC

## 2022-02-14 MED ORDER — LINACLOTIDE 72 MCG PO CAPS: 72.0000 ug | ORAL_CAPSULE | Freq: Every day | ORAL | 5 refills | Status: DC

## 2022-02-14 NOTE — Patient Instructions (Signed)
We have sent the following medications to your pharmacy for you to pick up at your convenience: ?Linzess 72 mcg daily before breakfast. ?Anusol HC cream apply to perianal area twice daily for 7 days. ? ?We have given you samples of the following medication to take: ?Recticare cream as needed. ? ?You have been scheduled for a colonoscopy. Please follow written instructions given to you at your visit today.  ?Please pick up your prep supplies at the pharmacy within the next 1-3 days. ?If you use inhalers (even only as needed), please bring them with you on the day of your procedure. ? ?If you are age 31 or older, your body mass index should be between 23-30. Your Body mass index is 22.86 kg/m?Marland Kitchen If this is out of the aforementioned range listed, please consider follow up with your Primary Care Provider. ? ?If you are age 18 or younger, your body mass index should be between 19-25. Your Body mass index is 22.86 kg/m?Marland Kitchen If this is out of the aformentioned range listed, please consider follow up with your Primary Care Provider.  ? ?________________________________________________________ ? ?The [facility] providers would like to encourage you to use [facility] to communicate with providers for non-urgent requests or questions.  Due to long hold times on the telephone, sending your provider a message by [facility] may be a faster and more efficient way to get a response.  Please allow 48 business hours for a response.  Please remember that this is for non-urgent requests.  ?_______________________________________________________ ? ?

## 2022-02-14 NOTE — Progress Notes (Signed)
? ?Chief Complaint: Colon cancer screening ? ?HPI : 46 year old female with history of depression and hemorrhoids presents with colon cancer screening. ? ?She has never had a colonoscopy in the past. She has had hemorrhoids for many years. She has had hemorrhoid ever since she had 4 kids in the past. The hemorrhoids come and go. A couple of weeks ago, the hemorrhoids have been protruding out from her bottom more consistently. Denies blood in stools. Endorses itching and pain. She has tried some OTC hemorrhoid therapies. She typically has issues with constipation. She has to strain on occasion. She is on average having about one BM every 4 days. If she takes laxative medications to help her have stools, she will have a stool once every morning. She does not feel like she is emptying fully when she has a BM. She is using docusate. She has tried Miralax in the past which caused her to have abdominal pain issues. Denies weight loss. Denies ab pain, N&V, dysphagia. Denies fam hx of colon cancer. ? ?Past Medical History:  ?Diagnosis Date  ? Depression   ? no meds  ? SVD (spontaneous vaginal delivery)   ? x 4  ? ? ?Past Surgical History:  ?Procedure Laterality Date  ? LAPAROSCOPIC TUBAL LIGATION Bilateral 05/21/2018  ? Procedure: LAPAROSCOPIC TUBAL LIGATION;  Surgeon: Emily Filbert, MD;  Location: [facility];  Service: Gynecology;  Laterality: Bilateral;  with filshie Clips   ? LAPAROSCOPY N/A 05/21/2018  ? Procedure: LAPAROSCOPY DIAGNOSTIC;  Surgeon: Emily Filbert, MD;  Location: [facility];  Service: Gynecology;  Laterality: N/A;  removal of two filshe clips in abdomen from a previous procedure   ? TUBAL LIGATION  2006  ? Failed Tubal  ? ?Family History  ?Problem Relation Age of Onset  ? Breast cancer Paternal Aunt 44  ? Heart disease Father   ? Hyperlipidemia Father   ? ?Social History  ? ?Tobacco Use  ? Smoking status: Never  ? Smokeless tobacco: Never  ?Vaping Use  ? Vaping Use: Never used  ?Substance Use Topics  ? Alcohol use: No   ? Drug use: No  ? ?Current Outpatient Medications  ?Medication Sig Dispense Refill  ? docusate sodium (COLACE) 100 MG capsule Take 1 capsule (100 mg total) by mouth 2 (two) times daily. 10 capsule 0  ? hydrocortisone (ANUSOL-HC) 2.5 % rectal cream Place 1 application. rectally 2 (two) times daily. 30 g 1  ? linaclotide (LINZESS) 72 MCG capsule Take 1 capsule (72 mcg total) by mouth daily before breakfast. 30 capsule 5  ? Na Sulfate-K Sulfate-Mg Sulf 17.5-3.13-1.6 GM/177ML SOLN Take 1 kit by mouth once for 1 dose. 354 mL 0  ? Vitamin D, Ergocalciferol, (DRISDOL) 1.25 MG (50000 UNIT) CAPS capsule Take 1 capsule (50,000 Units total) by mouth every 7 (seven) days. 12 capsule 0  ? ?No current facility-administered medications for this visit.  ? ?No Known Allergies ? ? ?Review of Systems: ?All systems reviewed and negative except where noted in HPI.  ? ?Physical Exam: ?BP 110/64   Pulse 79   Ht $R'5\' 5"'wS$  (1.651 m)   Wt 137 lb 6.4 oz (62.3 kg)   BMI 22.86 kg/m?  ?Constitutional: Pleasant,well-developed, female in no acute distress. ?HEENT: Normocephalic and atraumatic. Conjunctivae are normal. No scleral icterus. ?Cardiovascular: Normal rate, regular rhythm.  ?Pulmonary/chest: Effort normal and breath sounds normal. No wheezing, rales or rhonchi. ?Abdominal: Soft, nondistended, nontender. Bowel sounds active throughout. There are no masses palpable. No hepatomegaly. ?Extremities:  No edema ?Neurological: Alert and oriented to person place and time. ?Skin: Skin is warm and dry. No rashes noted. ?Psychiatric: Normal mood and affect. Behavior is normal. ? ?Labs 01/2022: CBC unremarkable. Ferritin 35. TSH nml at 1.52. Vitamin D levels low. CMP unremarkable ? ?ASSESSMENT AND PLAN: ? ?Colon cancer screening ?Constipation ?Hemorrhoids ?Patient presents with constipation issues and hemorrhoids. Will plan for colonoscopy to examine her hemorrhoids more in detail and to update her colon cancer screening. Patient has not responded to  several laxative medications in the past so will start her on Linzess to see if this helps with her constipation more consistently. ?- Start Anusol HC BID for 7 days ?- Recticare PRN. Will give samples today. ?- Start Linzess 72 mcg QD ?- Colonoscopy [facility] ? ?Christia Reading, MD ? ?

## 2022-03-03 ENCOUNTER — Encounter: Payer: Self-pay | Admitting: Internal Medicine

## 2022-03-10 ENCOUNTER — Encounter: Payer: Medicaid Other | Admitting: Internal Medicine

## 2022-04-06 ENCOUNTER — Encounter: Payer: Self-pay | Admitting: Nurse Practitioner

## 2022-04-06 ENCOUNTER — Ambulatory Visit (INDEPENDENT_AMBULATORY_CARE_PROVIDER_SITE_OTHER): Payer: Medicaid Other | Admitting: Nurse Practitioner

## 2022-04-06 ENCOUNTER — Encounter: Payer: Self-pay | Admitting: Internal Medicine

## 2022-04-06 VITALS — BP 110/60 | HR 70 | Temp 97.3°F | Ht 65.0 in | Wt 140.6 lb

## 2022-04-06 DIAGNOSIS — D573 Sickle-cell trait: Secondary | ICD-10-CM

## 2022-04-06 DIAGNOSIS — E7800 Pure hypercholesterolemia, unspecified: Secondary | ICD-10-CM | POA: Diagnosis not present

## 2022-04-06 DIAGNOSIS — R202 Paresthesia of skin: Secondary | ICD-10-CM

## 2022-04-06 DIAGNOSIS — Z0000 Encounter for general adult medical examination without abnormal findings: Secondary | ICD-10-CM

## 2022-04-06 DIAGNOSIS — E559 Vitamin D deficiency, unspecified: Secondary | ICD-10-CM

## 2022-04-06 DIAGNOSIS — K5904 Chronic idiopathic constipation: Secondary | ICD-10-CM | POA: Diagnosis not present

## 2022-04-06 DIAGNOSIS — Z0001 Encounter for general adult medical examination with abnormal findings: Secondary | ICD-10-CM

## 2022-04-06 HISTORY — DX: Sickle-cell trait: D57.3

## 2022-04-06 HISTORY — DX: Paresthesia of skin: R20.2

## 2022-04-06 LAB — LIPID PANEL
Cholesterol: 206 mg/dL — ABNORMAL HIGH (ref 0–200)
HDL: 74.7 mg/dL (ref >39.00)
LDL Cholesterol: 123 mg/dL — ABNORMAL HIGH (ref 0–99)
NonHDL: 130.87
Total CHOL/HDL Ratio: 3
Triglycerides: 41 mg/dL (ref 0.0–149.0)
VLDL: 8.2 mg/dL (ref 0.0–40.0)

## 2022-04-06 LAB — VITAMIN D 25 HYDROXY (VIT D DEFICIENCY, FRACTURES): VITD: 39.99 ng/mL (ref 30.00–100.00)

## 2022-04-06 MED ORDER — GABAPENTIN 100 MG PO CAPS: ORAL_CAPSULE | ORAL | 3 refills | Status: DC

## 2022-04-06 MED ORDER — LUBIPROSTONE 24 MCG PO CAPS: 24.0000 ug | ORAL_CAPSULE | Freq: Two times a day (BID) | ORAL | 2 refills | Status: DC

## 2022-04-06 NOTE — Assessment & Plan Note (Signed)
Upcoming appt for colonoscopy No improvement with linzess , and .  D/c linzess Start [employer]

## 2022-04-06 NOTE — Assessment & Plan Note (Signed)
Repeat vit. D °

## 2022-04-06 NOTE — Progress Notes (Signed)
Complete physical exam  Patient: Sara Krueger   DOB: [birth year]   46 y.o. Female  MRN: [id number] Visit Date: 04/06/2022  Subjective:    Chief Complaint  Patient presents with   Annual Exam    CPE Pt fasting Pain in both legs & feet No other concerns    Sara Krueger is a 46 y.o. female who presents today for a complete physical exam. She reports consuming a general diet. Gym/ health club routine includes cardio, high impact aerobics , and mod to heavy weightlifting. She generally feels well. She reports sleeping well. She does not have additional problems to discuss today.  Vision:Within the last year Dental:Within Last 6 months STD Screen:Yes  Most recent fall risk assessment:    03/24/2021   10:44 AM  Fall Risk   Falls in the past year? 0  Number falls in past yr: 0  Injury with Fall? 0  Risk for fall due to : No Fall Risks  Follow up Falls evaluation completed   Most recent depression screenings:    04/06/2022   10:18 AM 03/25/2021    4:18 PM  PHQ 2/9 Scores  PHQ - 2 Score 0 1  PHQ- 9 Score 6 7   HPI  Chronic idiopathic constipation Upcoming appt for colonoscopy No improvement with linzess , and .  D/c linzess Start amitiza  Paresthesia Chronic, onset 55months ago, waxing and waning, describes as intermittent tingling, itching and cramps in bilateral LE. No specific trigger. No pain with walking or during exercise regimen. Normal cbc, Cmp, TSh, B12, and iron  She agreed to start gabapentin 100mg , titrate up to 100mg  BID. F/up in 1-80months  Vitamin D insufficiency Repeat vit. D   Past Medical History:  Diagnosis Date   Depression    no meds   SVD (spontaneous vaginal delivery)    x 4   Past Surgical History:  Procedure Laterality Date   LAPAROSCOPIC TUBAL LIGATION Bilateral 05/21/2018   Procedure: LAPAROSCOPIC TUBAL LIGATION;  Surgeon: 1month, MD;  Location: [facility];  Service: Gynecology;  Laterality:  Bilateral;  with filshie Clips    LAPAROSCOPY N/A 05/21/2018   Procedure: LAPAROSCOPY DIAGNOSTIC;  Surgeon: Allie Bossier, MD;  Location: [facility];  Service: Gynecology;  Laterality: N/A;  removal of two filshe clips in abdomen from a previous procedure    TUBAL LIGATION  2006   Failed Tubal   Social History   Socioeconomic History   Marital status: Single    Spouse name: Not on file   Number of children: Not on file   Years of education: Not on file   Highest education level: Not on file  Occupational History   Not on file  Tobacco Use   Smoking status: Never   Smokeless tobacco: Never  Vaping Use   Vaping Use: Never used  Substance and Sexual Activity   Alcohol use: No   Drug use: No   Sexual activity: Yes    Birth control/protection: Surgical    Comment: tubal ligation  Other Topics Concern   Not on file  Social History Narrative   ** Merged History Encounter **       Social Determinants of Health   Financial Resource Strain: Not on file  Food Insecurity: Not on file  Transportation Needs: Not on file  Physical Activity: Not on file  Stress: Not on file  Social Connections: Not on file  Intimate Partner Violence: Not on file  Family Status  Relation Name Status   Emelda BrothersPat Aunt  (Not Specified)   Father  Alive   Family History  Problem Relation Age of Onset   Breast cancer Paternal Aunt [id number]   Heart disease Father    Hyperlipidemia Father    No Known Allergies  Patient Care Team: Munachimso Rigdon, Bonna Gainsharlotte Lum, NP as PCP - General (Internal Medicine)   Medications: Outpatient Medications Prior to Visit  Medication Sig   Vitamin D, Ergocalciferol, (DRISDOL) 1.25 MG (50000 UNIT) CAPS capsule Take 1 capsule (50,000 Units total) by mouth every 7 (seven) days.   [DISCONTINUED] docusate sodium (COLACE) 100 MG capsule Take 1 capsule (100 mg total) by mouth 2 (two) times daily. (Patient not taking: Reported on 04/06/2022)   [DISCONTINUED] hydrocortisone (ANUSOL-HC) 2.5 % rectal  cream Place 1 application. rectally 2 (two) times daily. (Patient not taking: Reported on 04/06/2022)   [DISCONTINUED] linaclotide (LINZESS) 72 MCG capsule Take 1 capsule (72 mcg total) by mouth daily before breakfast. (Patient not taking: Reported on 04/06/2022)   No facility-administered medications prior to visit.   Review of Systems  Constitutional:  Negative for fever.  HENT:  Negative for congestion and sore throat.   Eyes:        Negative for visual changes  Respiratory:  Negative for cough and shortness of breath.   Cardiovascular:  Negative for chest pain, palpitations and leg swelling.  Gastrointestinal:  Negative for blood in stool, constipation and diarrhea.  Genitourinary:  Negative for dysuria, frequency and urgency.  Musculoskeletal:  Positive for myalgias.  Skin:  Negative for rash.  Neurological:  Negative for dizziness, weakness and headaches.  Hematological:  Does not bruise/bleed easily.  Psychiatric/Behavioral:  Negative for suicidal ideas. The patient is not nervous/anxious.        Objective:  BP 110/60 (BP Location: Right Arm, Patient Position: Sitting, Cuff Size: Small)   Pulse 70   Temp (!) 97.3 F (36.3 C) (Temporal)   Ht 5\' 5"  (1.651 m)   Wt 140 lb 9.6 oz (63.8 kg)   SpO2 99%   BMI 23.40 kg/m       Physical Exam Vitals reviewed.  Constitutional:      General: She is not in acute distress.    Appearance: She is well-developed.  HENT:     Right Ear: Tympanic membrane, ear canal and external ear normal.     Left Ear: Tympanic membrane, ear canal and external ear normal.     Nose: Nose normal.  Eyes:     Extraocular Movements: Extraocular movements intact.     Conjunctiva/sclera: Conjunctivae normal.  Cardiovascular:     Rate and Rhythm: Normal rate and regular rhythm.     Pulses: Normal pulses.     Heart sounds: Normal heart sounds.  Pulmonary:     Effort: Pulmonary effort is normal. No respiratory distress.     Breath sounds: Normal breath  sounds.  Chest:     Chest wall: No tenderness.  Abdominal:     General: Bowel sounds are normal.     Palpations: Abdomen is soft.  Musculoskeletal:        General: Normal range of motion.     Cervical back: Normal range of motion and neck supple.     Right lower leg: No edema.     Left lower leg: No edema.  Lymphadenopathy:     Cervical: No cervical adenopathy.  Skin:    General: Skin is warm and dry.     Coloration: Skin  is not pale.     Findings: No erythema or rash.  Neurological:     Mental Status: She is alert and oriented to person, place, and time.     Motor: No weakness.     Gait: Gait normal.     Deep Tendon Reflexes: Reflexes are normal and symmetric. Reflexes normal.  Psychiatric:        Mood and Affect: Mood normal.        Behavior: Behavior normal.        Thought Content: Thought content normal.     No results found for any visits on 04/06/22.    Assessment & Plan:    Routine Health Maintenance and Physical Exam  Immunization History  Administered Date(s) Administered   PFIZER Comirnaty(Gray Top)Covid-19 Tri-Sucrose Vaccine 01/08/2021   PFIZER(Purple Top)SARS-COV-2 Vaccination 12/18/2020   Tdap 07/02/2018    Health Maintenance  Topic Date Due   COLONOSCOPY (Pts 45-58yrs Insurance coverage will need to be confirmed)  Never done   COVID-19 Vaccine (3 - Booster for [employer] series) 04/22/2022 (Originally 03/05/2021)   INFLUENZA VACCINE  06/13/2022   PAP SMEAR-Modifier  12/07/2022   TETANUS/TDAP  07/02/2028   Hepatitis C Screening  Completed   HIV Screening  Completed   HPV VACCINES  Aged Out   Discussed health benefits of physical activity, and encouraged her to engage in regular exercise appropriate for her age and condition.  Problem List Items Addressed This Visit       Digestive   Chronic idiopathic constipation    Upcoming appt for colonoscopy No improvement with linzess , and .  D/c linzess Start amitiza       Relevant  Medications   lubiprostone (AMITIZA) 24 MCG capsule     Other   Paresthesia    Chronic, onset 59months ago, waxing and waning, describes as intermittent tingling, itching and cramps in bilateral LE. No specific trigger. No pain with walking or during exercise regimen. Normal cbc, Cmp, TSh, B12, and iron  She agreed to start gabapentin 100mg , titrate up to 100mg  BID. F/up in 1-66months       Relevant Medications   gabapentin (NEURONTIN) 100 MG capsule   Vitamin D insufficiency    Repeat vit. D       Relevant Orders   Vitamin D (25 hydroxy)   Other Visit Diagnoses     Encounter for preventative adult health care exam with abnormal findings    -  Primary   Hypercholesteremia       Relevant Orders   Lipid panel      Return in about 3 months (around 07/07/2022) for paresthesia.     1month, NP

## 2022-04-06 NOTE — Patient Instructions (Signed)
Go to lab Continue use of compression stocking Start gabapentin and amitiza Stop linzess Maintain appt with GI  Preventive Care 47-47 Years Old, Female Preventive care refers to lifestyle choices and visits with your health care provider that can promote health and wellness. Preventive care visits are also called wellness exams. What can I expect for my preventive care visit? Counseling Your health care provider may ask you questions about your: Medical history, including: Past medical problems. Family medical history. Pregnancy history. Current health, including: Menstrual cycle. Method of birth control. Emotional well-being. Home life and relationship well-being. Sexual activity and sexual health. Lifestyle, including: Alcohol, nicotine or tobacco, and drug use. Access to firearms. Diet, exercise, and sleep habits. Work and work Statistician. Sunscreen use. Safety issues such as seatbelt and bike helmet use. Physical exam Your health care provider will check your: Height and weight. These may be used to calculate your BMI (body mass index). BMI is a measurement that tells if you are at a healthy weight. Waist circumference. This measures the distance around your waistline. This measurement also tells if you are at a healthy weight and may help predict your risk of certain diseases, such as type 2 diabetes and high blood pressure. Heart rate and blood pressure. Body temperature. Skin for abnormal spots. What immunizations do I need?  Vaccines are usually given at various ages, according to a schedule. Your health care provider will recommend vaccines for you based on your age, medical history, and lifestyle or other factors, such as travel or where you work. What tests do I need? Screening Your health care provider may recommend screening tests for certain conditions. This may include: Lipid and cholesterol levels. Diabetes screening. This is done by checking your blood  sugar (glucose) after you have not eaten for a while (fasting). Pelvic exam and Pap test. Hepatitis B test. Hepatitis C test. HIV (human immunodeficiency virus) test. STI (sexually transmitted infection) testing, if you are at risk. Lung cancer screening. Colorectal cancer screening. Mammogram. Talk with your health care provider about when you should start having regular mammograms. This may depend on whether you have a family history of breast cancer. BRCA-related cancer screening. This may be done if you have a family history of breast, ovarian, tubal, or peritoneal cancers. Bone density scan. This is done to screen for osteoporosis. Talk with your health care provider about your test results, treatment options, and if necessary, the need for more tests. Follow these instructions at home: Eating and drinking  Eat a diet that includes fresh fruits and vegetables, whole grains, lean protein, and low-fat dairy products. Take vitamin and mineral supplements as recommended by your health care provider. Do not drink alcohol if: Your health care provider tells you not to drink. You are pregnant, may be pregnant, or are planning to become pregnant. If you drink alcohol: Limit how much you have to 0-1 drink a day. Know how much alcohol is in your drink. In the [place] one drink equals one 12 oz bottle of beer (355 mL), one 5 oz glass of wine (148 mL), or one 1 oz glass of hard liquor (44 mL). Lifestyle Brush your teeth every morning and night with fluoride toothpaste. Floss one time each day. Exercise for at least 30 minutes 5 or more days each week. Do not use any products that contain nicotine or tobacco. These products include cigarettes, chewing tobacco, and vaping devices, such as e-cigarettes. If you need help quitting, ask your health care provider. Do not  use drugs. If you are sexually active, practice safe sex. Use a condom or other form of protection to prevent STIs. If you do not  wish to become pregnant, use a form of birth control. If you plan to become pregnant, see your health care provider for a prepregnancy visit. Take aspirin only as told by your health care provider. Make sure that you understand how much to take and what form to take. Work with your health care provider to find out whether it is safe and beneficial for you to take aspirin daily. Find healthy ways to manage stress, such as: Meditation, yoga, or listening to music. Journaling. Talking to a trusted person. Spending time with friends and family. Minimize exposure to UV radiation to reduce your risk of skin cancer. Safety Always wear your seat belt while driving or riding in a vehicle. Do not drive: If you have been drinking alcohol. Do not ride with someone who has been drinking. When you are tired or distracted. While texting. If you have been using any mind-altering substances or drugs. Wear a helmet and other protective equipment during sports activities. If you have firearms in your house, make sure you follow all gun safety procedures. Seek help if you have been physically or sexually abused. What's next? Visit your health care provider once a year for an annual wellness visit. Ask your health care provider how often you should have your eyes and teeth checked. Stay up to date on all vaccines. This information is not intended to replace advice given to you by your health care provider. Make sure you discuss any questions you have with your health care provider. Document Revised: 04/27/2021 Document Reviewed: 04/27/2021 Elsevier Patient Education  [place].

## 2022-04-06 NOTE — Assessment & Plan Note (Signed)
Chronic, onset 32months ago, waxing and waning, describes as intermittent tingling, itching and cramps in bilateral LE. No specific trigger. No pain with walking or during exercise regimen. Normal cbc, Cmp, TSh, B12, and iron  She agreed to start gabapentin 100mg , titrate up to 100mg  BID. F/up in 1-23months

## 2022-04-14 ENCOUNTER — Encounter: Payer: Self-pay | Admitting: Obstetrics & Gynecology

## 2022-04-28 ENCOUNTER — Encounter: Payer: Medicaid Other | Admitting: Internal Medicine

## 2022-05-07 ENCOUNTER — Other Ambulatory Visit: Payer: Self-pay | Admitting: Nurse Practitioner

## 2022-05-07 DIAGNOSIS — E559 Vitamin D deficiency, unspecified: Secondary | ICD-10-CM

## 2022-05-10 ENCOUNTER — Ambulatory Visit: Payer: Medicaid Other | Admitting: Physician Assistant

## 2022-06-06 ENCOUNTER — Encounter: Payer: Self-pay | Admitting: Obstetrics & Gynecology

## 2022-06-06 ENCOUNTER — Other Ambulatory Visit (HOSPITAL_COMMUNITY)
Admission: RE | Admit: 2022-06-06 | Discharge: 2022-06-06 | Disposition: A | Discharge status: Home / Self Care | Payer: Medicaid Other | Source: Ambulatory Visit | Attending: Obstetrics & Gynecology | Admitting: Obstetrics & Gynecology

## 2022-06-06 ENCOUNTER — Ambulatory Visit (INDEPENDENT_AMBULATORY_CARE_PROVIDER_SITE_OTHER): Payer: Medicaid Other | Admitting: Obstetrics & Gynecology

## 2022-06-06 VITALS — BP 116/76 | HR 74 | Wt 135.6 lb

## 2022-06-06 DIAGNOSIS — N926 Irregular menstruation, unspecified: Secondary | ICD-10-CM | POA: Diagnosis not present

## 2022-06-06 DIAGNOSIS — Z124 Encounter for screening for malignant neoplasm of cervix: Secondary | ICD-10-CM

## 2022-06-06 DIAGNOSIS — N898 Other specified noninflammatory disorders of vagina: Secondary | ICD-10-CM

## 2022-06-06 DIAGNOSIS — Z3202 Encounter for pregnancy test, result negative: Secondary | ICD-10-CM | POA: Diagnosis not present

## 2022-06-06 DIAGNOSIS — B9689 Other specified bacterial agents as the cause of diseases classified elsewhere: Secondary | ICD-10-CM

## 2022-06-06 DIAGNOSIS — Z113 Encounter for screening for infections with a predominantly sexual mode of transmission: Secondary | ICD-10-CM | POA: Insufficient documentation

## 2022-06-06 DIAGNOSIS — N760 Acute vaginitis: Secondary | ICD-10-CM

## 2022-06-06 LAB — POCT URINE PREGNANCY: Preg Test, Ur: NEGATIVE

## 2022-06-06 NOTE — Progress Notes (Signed)
GYNECOLOGY OFFICE VISIT NOTE  History:   Sara Krueger is a 46 y.o. [phone] here today for evaluation of missed periods since February 2023.  Also wants to be checked for everything, has been with a new sexual partner for last seven months. Noticed tiny bumps on his penis; he said they have been there since birth but she is worried about HPV.  Desires comprehensive STI screen. She denies any abnormal vaginal discharge, bleeding, pelvic pain or other concerns.    Past Medical History:  Diagnosis Date   Depression    no meds   SVD (spontaneous vaginal delivery)    x 4    Past Surgical History:  Procedure Laterality Date   LAPAROSCOPIC TUBAL LIGATION Bilateral 05/21/2018   Procedure: LAPAROSCOPIC TUBAL LIGATION;  Surgeon: Allie Bossier, MD;  Location: [facility];  Service: Gynecology;  Laterality: Bilateral;  with filshie Clips    LAPAROSCOPY N/A 05/21/2018   Procedure: LAPAROSCOPY DIAGNOSTIC;  Surgeon: Allie Bossier, MD;  Location: [facility];  Service: Gynecology;  Laterality: N/A;  removal of two filshe clips in abdomen from a previous procedure    TUBAL LIGATION  2006   Failed Tubal    The following portions of the patient's history were reviewed and updated as appropriate: allergies, current medications, past family history, past medical history, past social history, past surgical history and problem list.   Health Maintenance:  Normal pap and negative HRHPV on 12/08/2019.  Normal mammogram on 01/20/2022.   Review of Systems:  Pertinent items noted in HPI and remainder of comprehensive ROS otherwise negative.  Physical Exam:  BP 116/76   Pulse 74   Wt 135 lb 9.6 oz (61.5 kg)   LMP 05/07/2022 (Approximate)   BMI 22.57 kg/m  CONSTITUTIONAL: Well-developed, well-nourished female in no acute distress.  HEENT:  Normocephalic, atraumatic. External right and left ear normal. No scleral icterus.  NECK: Normal range of motion, supple, no masses noted on observation SKIN: No rash noted.  Not diaphoretic. No erythema. No pallor. MUSCULOSKELETAL: Normal range of motion. No edema noted. NEUROLOGIC: Alert and oriented to person, place, and time. Normal muscle tone coordination. No cranial nerve deficit noted. PSYCHIATRIC: Normal mood and affect. Normal behavior. Normal judgment and thought content. CARDIOVASCULAR: Normal heart rate noted RESPIRATORY: Effort and breath sounds normal, no problems with respiration noted ABDOMEN: No masses noted. No other overt distention noted.   PELVIC: Normal appearing external genitalia; normal urethral meatus; normal appearing vaginal mucosa and cervix with prominent ectropion.  Copious white vaginal discharge noted, testing sample obtained.  No lesions seen.  Pap smear obtained.  Normal uterine size, no other palpable masses, no uterine or adnexal tenderness. Performed in the presence of a chaperone.  Labs: Results for orders placed or performed in visit on 06/06/22 (from the past 24 hour(s))  POCT urine pregnancy     Status: None   Collection Time: 06/06/22 11:22 AM  Result Value Ref Range   Preg Test, Ur Negative Negative      Assessment and Plan:     1. Missed periods - POCT urine pregnancy negative Discussed that this is likely perimenopausal. She had normal TSH in 01/2022, no other known etiologies. Also reports occasional mood symptoms alleviated by exercise and yoga.  She was reassured, told that she may have heavier periods when it resumes and to contact us for any concerns. She was given information about perimenopause in general.  2. Vaginal discharge - Cervicovaginal ancillary only( [place]) done,  will follow up results and manage accordingly.  3. Routine screening for STI (sexually transmitted infection) STI screen done, will follow up results and manage accordingly. - RPR+HBsAg+HCVAb+HIV - Cytology - PAP - Cervicovaginal ancillary only( [place])  4. Pap smear for cervical cancer screening - Cytology - PAP done, will  follow up results and manage accordingly.  Routine preventative health maintenance measures emphasized. Please refer to After Visit Summary for other counseling recommendations.   Return for any gynecologic concerns.    I spent 30 minutes dedicated to the care of this patient including pre-visit review of records, face to face time with the patient discussing her conditions and treatments and post visit orders.    Jaynie Collins, MD, FACOG Obstetrician & Gynecologist, [facility] for [employer], [facility]

## 2022-06-07 LAB — RPR+HBSAG+HCVAB+...
HIV Screen 4th Generation wRfx: NONREACTIVE
Hep C Virus Ab: NONREACTIVE
Hepatitis B Surface Ag: NEGATIVE
RPR Ser Ql: NONREACTIVE

## 2022-06-07 LAB — CERVICOVAGINAL ANCILLARY ONLY
Bacterial Vaginitis (gardnerella): POSITIVE — AB
Candida Glabrata: NEGATIVE
Candida Vaginitis: NEGATIVE
Chlamydia: NEGATIVE
Comment: NEGATIVE
Comment: NEGATIVE
Comment: NEGATIVE
Comment: NEGATIVE
Comment: NEGATIVE
Comment: NORMAL
Neisseria Gonorrhea: NEGATIVE
Trichomonas: NEGATIVE

## 2022-06-07 MED ORDER — METRONIDAZOLE 500 MG PO TABS: 500.0000 mg | ORAL_TABLET | Freq: Two times a day (BID) | ORAL | 0 refills | Status: AC

## 2022-06-07 NOTE — Addendum Note (Signed)
Addended by: Jaynie Collins A on: 06/07/2022 02:52 PM   Modules accepted: Orders

## 2022-06-09 ENCOUNTER — Telehealth: Payer: Self-pay | Admitting: Internal Medicine

## 2022-06-09 DIAGNOSIS — K5900 Constipation, unspecified: Secondary | ICD-10-CM

## 2022-06-09 DIAGNOSIS — Z1211 Encounter for screening for malignant neoplasm of colon: Secondary | ICD-10-CM

## 2022-06-09 MED ORDER — NA SULFATE-K SULFATE-MG SULF 17.5-3.13-1.6 GM/177ML PO SOLN: 1.0000 | Freq: Once | ORAL | 0 refills | Status: AC

## 2022-06-09 NOTE — Telephone Encounter (Signed)
Rx for suprep sent to [facility] at the corner of [address] and [address].  Pt called and notified.

## 2022-06-12 ENCOUNTER — Encounter: Payer: Self-pay | Admitting: Internal Medicine

## 2022-06-12 ENCOUNTER — Ambulatory Visit (AMBULATORY_SURGERY_CENTER): Payer: Medicaid Other | Admitting: Internal Medicine

## 2022-06-12 VITALS — BP 113/70 | HR 63 | Temp 97.8°F | Resp 14 | Ht 65.0 in | Wt 135.0 lb

## 2022-06-12 DIAGNOSIS — K5900 Constipation, unspecified: Secondary | ICD-10-CM

## 2022-06-12 DIAGNOSIS — Z1211 Encounter for screening for malignant neoplasm of colon: Secondary | ICD-10-CM

## 2022-06-12 LAB — CYTOLOGY - PAP
Comment: NEGATIVE
Comment: NEGATIVE
Diagnosis: NEGATIVE
Diagnosis: REACTIVE
HSV1: NEGATIVE
HSV2: NEGATIVE
High risk HPV: NEGATIVE

## 2022-06-12 MED ORDER — SODIUM CHLORIDE 0.9 % IV SOLN: 500.0000 mL | Freq: Once | INTRAVENOUS | Status: DC

## 2022-06-12 MED ORDER — HYDROCORTISONE (PERIANAL) 2.5 % EX CREA: 1.0000 | TOPICAL_CREAM | Freq: Two times a day (BID) | CUTANEOUS | 1 refills | Status: DC

## 2022-06-12 NOTE — Progress Notes (Signed)
GASTROENTEROLOGY PROCEDURE H&P NOTE   Primary Care Physician: Anne Ng, NP    Reason for Procedure:   Colon cancer screening, constipation, hemorrhoids  Plan:    Colonoscopy  Patient is appropriate for endoscopic procedure(s) in the ambulatory ([facility]) setting.  The nature of the procedure, as well as the risks, benefits, and alternatives were carefully and thoroughly reviewed with the patient. Ample time for discussion and questions allowed. The patient understood, was satisfied, and agreed to proceed.     HPI: Sara Krueger is a 46 y.o. female who presents for colonoscopy for evaluation of colon cancer screening, constipation, hemorrhoids .  Patient was most recently seen in the Gastroenterology Clinic on 02/14/22.  No interval change in medical history since that appointment. Please refer to that note for full details regarding GI history and clinical presentation.   Past Medical History:  Diagnosis Date   Depression    no meds   SVD (spontaneous vaginal delivery)    x 4    Past Surgical History:  Procedure Laterality Date   LAPAROSCOPIC TUBAL LIGATION Bilateral 05/21/2018   Procedure: LAPAROSCOPIC TUBAL LIGATION;  Surgeon: Allie Bossier, MD;  Location: [facility];  Service: Gynecology;  Laterality: Bilateral;  with filshie Clips    LAPAROSCOPY N/A 05/21/2018   Procedure: LAPAROSCOPY DIAGNOSTIC;  Surgeon: Allie Bossier, MD;  Location: [facility];  Service: Gynecology;  Laterality: N/A;  removal of two filshe clips in abdomen from a previous procedure    TUBAL LIGATION  2006   Failed Tubal    Prior to Admission medications   Medication Sig Start Date End Date Taking? Authorizing Provider  metroNIDAZOLE (FLAGYL) 500 MG tablet Take 1 tablet (500 mg total) by mouth 2 (two) times daily for 7 days. Patient not taking: Reported on 06/12/2022 06/07/22 06/14/22  Tereso Newcomer, MD    Current Outpatient Medications  Medication Sig Dispense Refill   metroNIDAZOLE (FLAGYL)  500 MG tablet Take 1 tablet (500 mg total) by mouth 2 (two) times daily for 7 days. (Patient not taking: Reported on 06/12/2022) 14 tablet 0   Current Facility-Administered Medications  Medication Dose Route Frequency Provider Last Rate Last Admin   0.9 %  sodium chloride infusion  500 mL Intravenous Once Imogene Burn, MD        Allergies as of 06/12/2022   (No Known Allergies)    Family History  Problem Relation Age of Onset   Heart disease Father    Hyperlipidemia Father    Breast cancer Paternal Aunt 97    Social History   Socioeconomic History   Marital status: Single    Spouse name: Not on file   Number of children: Not on file   Years of education: Not on file   Highest education level: Not on file  Occupational History   Not on file  Tobacco Use   Smoking status: Never   Smokeless tobacco: Never  Vaping Use   Vaping Use: Never used  Substance and Sexual Activity   Alcohol use: No   Drug use: No   Sexual activity: Yes    Birth control/protection: Surgical    Comment: tubal ligation  Other Topics Concern   Not on file  Social History Narrative   ** Merged History Encounter **       Social Determinants of Health   Financial Resource Strain: Not on file  Food Insecurity: Not on file  Transportation Needs: Not on file  Physical Activity: Not  on file  Stress: Not on file  Social Connections: Not on file  Intimate Partner Violence: Not on file    Physical Exam: Vital signs in last 24 hours: BP 124/81   Pulse 95   Temp 97.8 F (36.6 C) (Skin)   Ht 5\' 5"  (1.651 m)   Wt 135 lb (61.2 kg)   LMP 05/07/2022 (Approximate)   SpO2 100%   BMI 22.47 kg/m  GEN: NAD EYE: Sclerae anicteric ENT: MMM CV: Non-tachycardic Pulm: No increased WOB GI: Soft NEURO:  Alert & Oriented   05/09/2022, MD [facility]   06/12/2022 9:05 AM

## 2022-06-12 NOTE — Progress Notes (Signed)
Pt's states no medical or surgical changes since previsit or office visit. VS assessed by C.W 

## 2022-06-12 NOTE — Patient Instructions (Signed)
Start using Anusol cream YOU HAD AN ENDOSCOPIC PROCEDURE TODAY: Refer to the procedure report and other information in the discharge instructions given to you for any specific questions about what was found during the examination. If this information does not answer your questions, please call [place] office at [phone] to clarify.   YOU SHOULD EXPECT: Some feelings of bloating in the abdomen. Passage of more gas than usual. Walking can help get rid of the air that was put into your GI tract during the procedure and reduce the bloating. If you had a lower endoscopy (such as a colonoscopy or flexible sigmoidoscopy) you may notice spotting of blood in your stool or on the toilet paper. Some abdominal soreness may be present for a day or two, also.  DIET: Your first meal following the procedure should be a light meal and then it is ok to progress to your normal diet. A half-sandwich or bowl of soup is an example of a good first meal. Heavy or fried foods are harder to digest and may make you feel nauseous or bloated. Drink plenty of fluids but you should avoid alcoholic beverages for 24 hours. If you had a esophageal dilation, please see attached instructions for diet.    ACTIVITY: Your care partner should take you home directly after the procedure. You should plan to take it easy, moving slowly for the rest of the day. You can resume normal activity the day after the procedure however YOU SHOULD NOT DRIVE, use power tools, machinery or perform tasks that involve climbing or major physical exertion for 24 hours (because of the sedation medicines used during the test).   SYMPTOMS TO REPORT IMMEDIATELY: A gastroenterologist can be reached at any hour. Please call [phone]  for any of the following symptoms:  Following lower endoscopy (colonoscopy, flexible sigmoidoscopy) Excessive amounts of blood in the stool  Significant tenderness, worsening of abdominal pains  Swelling of the abdomen that is  new, acute  Fever of 100 or higher   FOLLOW UP:  If any biopsies were taken you will be contacted by phone or by letter within the next 1-3 weeks. Call [phone]  if you have not heard about the biopsies in 3 weeks.  Please also call with any specific questions about appointments or follow up tests.

## 2022-06-12 NOTE — Op Note (Signed)
[facility] Patient Name: Sara Krueger Procedure Date: 06/12/2022 9:13 AM MRN: [id number] Endoscopist: Nicole Kindred "[name] ,  Age: 46 Referring MD:  Date of Birth: [birth year] Gender: Female Account #: [id number] Procedure:                Colonoscopy Indications:              Screening for colorectal malignant neoplasm Medicines:                Monitored Anesthesia Care Procedure:                Pre-Anesthesia Assessment:                           - Prior to the procedure, a History and Physical                            was performed, and patient medications and                            allergies were reviewed. The patient's tolerance of                            previous anesthesia was also reviewed. The risks                            and benefits of the procedure and the sedation                            options and risks were discussed with the patient.                            All questions were answered, and informed consent                            was obtained. Prior Anticoagulants: The patient has                            taken no previous anticoagulant or antiplatelet                            agents. ASA Grade Assessment: II - A patient with                            mild systemic disease. After reviewing the risks                            and benefits, the patient was deemed in                            satisfactory condition to undergo the procedure.                           After obtaining informed consent, the colonoscope  was passed under direct vision. Throughout the                            procedure, the patient's blood pressure, pulse, and                            oxygen saturations were monitored continuously. The                            PCF-HQ190L Colonoscope was introduced through the                            anus and advanced to the the terminal ileum. The                             colonoscopy was performed without difficulty. The                            patient tolerated the procedure well. The quality                            of the bowel preparation was good. The terminal                            ileum, ileocecal valve, appendiceal orifice, and                            rectum were photographed. Scope In: 9:17:44 AM Scope Out: 9:36:07 AM Scope Withdrawal Time: 0 hours 12 minutes 42 seconds  Total Procedure Duration: 0 hours 18 minutes 23 seconds  Findings:                 The terminal ileum appeared normal.                           Non-bleeding internal hemorrhoids were found during                            retroflexion.                           The exam was otherwise without abnormality. Complications:            No immediate complications. Estimated Blood Loss:     Estimated blood loss was minimal. Impression:               - The examined portion of the ileum was normal.                           - Non-bleeding internal hemorrhoids.                           - The examination was otherwise normal.                           - No specimens collected. Recommendation:           -  Discharge patient to home (with escort).                           - Repeat colonoscopy in 10 years for screening                            purposes.                           - Anusol HC cream BID for 7 days                           - The findings and recommendations were discussed                            with the patient.                           - Return to GI clinic in 6 weeks. Nicole Kindred "[name],  06/12/2022 9:39:56 AM

## 2022-06-12 NOTE — Progress Notes (Signed)
Report to PACU, RN, vss, BBS= Clear.  

## 2022-06-14 ENCOUNTER — Telehealth: Payer: Self-pay | Admitting: *Deleted

## 2022-06-14 NOTE — Telephone Encounter (Signed)
  Follow up Call-     06/12/2022    8:44 AM  Call back number  Post procedure Call Back phone  # [phone]  Permission to leave phone message Yes     Patient questions:  Do you have a fever, pain , or abdominal swelling? No. Pain Score  0 *  Have you tolerated food without any problems? Yes.    Have you been able to return to your normal activities? Yes.    Do you have any questions about your discharge instructions: Diet   No. Medications  No. Follow up visit  No.  Do you have questions or concerns about your Care? No.  Actions: * If pain score is 4 or above: No action needed, pain <4.

## 2022-09-12 ENCOUNTER — Emergency Department (HOSPITAL_COMMUNITY)
Admission: EM | Admit: 2022-09-12 | Discharge: 2022-09-12 | Discharge status: Against Medical Advice | Payer: Medicaid Other | Attending: Emergency Medicine | Admitting: Emergency Medicine

## 2022-09-12 DIAGNOSIS — R55 Syncope and collapse: Secondary | ICD-10-CM | POA: Diagnosis present

## 2022-09-12 DIAGNOSIS — Z5321 Procedure and treatment not carried out due to patient leaving prior to being seen by health care provider: Secondary | ICD-10-CM | POA: Diagnosis not present

## 2022-09-12 NOTE — ED Notes (Addendum)
PATIENT LEFT AMA IV HAS REMOVED

## 2022-09-12 NOTE — ED Triage Notes (Signed)
Pt BIBA from work. Pt had syncopal episode in her office today, pt was assisted to the ground. Pt was out for 1 minute. Pt has had episodes of falling back asleep- EMS describes as narcolepsy.   NSR with EMS.  126/90 76 HR 117 CBG 100% RA  18 L AC

## 2022-11-08 ENCOUNTER — Encounter: Payer: Self-pay | Admitting: Internal Medicine

## 2023-01-03 ENCOUNTER — Encounter: Payer: Self-pay | Admitting: Obstetrics & Gynecology

## 2023-01-26 ENCOUNTER — Other Ambulatory Visit: Payer: Self-pay | Admitting: Nurse Practitioner

## 2023-01-26 DIAGNOSIS — Z1231 Encounter for screening mammogram for malignant neoplasm of breast: Secondary | ICD-10-CM

## 2023-02-08 ENCOUNTER — Ambulatory Visit: Payer: Medicaid Other | Admitting: Nurse Practitioner

## 2023-02-08 ENCOUNTER — Telehealth: Payer: Self-pay | Admitting: Nurse Practitioner

## 2023-02-08 NOTE — Telephone Encounter (Signed)
Pt was a no show for an OV with [place] on 02/08/23, I sent a no show letter.

## 2023-02-15 ENCOUNTER — Encounter: Payer: Self-pay | Admitting: Nurse Practitioner

## 2023-02-15 NOTE — Telephone Encounter (Signed)
Final warning letter sent via mail and mychart 

## 2023-02-15 NOTE — Telephone Encounter (Signed)
1st no show since 2022, 3rd no show total, cannot bill fee (Medicaid plan), msg sent to provider   Do you want me to send a final warning letter?

## 2023-02-19 ENCOUNTER — Telehealth: Payer: Self-pay

## 2023-02-19 NOTE — Telephone Encounter (Signed)
Left message for pt to call office back regarding message with answering service.

## 2023-02-22 ENCOUNTER — Ambulatory Visit: Payer: Medicaid Other | Admitting: Obstetrics and Gynecology

## 2023-03-16 ENCOUNTER — Ambulatory Visit
Admission: RE | Admit: 2023-03-16 | Discharge: 2023-03-16 | Disposition: A | Discharge status: Home / Self Care | Payer: Medicaid Other | Source: Ambulatory Visit | Attending: Nurse Practitioner | Admitting: Nurse Practitioner

## 2023-03-16 DIAGNOSIS — Z1231 Encounter for screening mammogram for malignant neoplasm of breast: Secondary | ICD-10-CM

## 2023-03-19 ENCOUNTER — Ambulatory Visit: Payer: Medicaid Other | Admitting: Obstetrics & Gynecology

## 2023-03-19 ENCOUNTER — Other Ambulatory Visit: Payer: Self-pay | Admitting: Nurse Practitioner

## 2023-03-19 ENCOUNTER — Encounter: Payer: Self-pay | Admitting: Obstetrics & Gynecology

## 2023-03-19 ENCOUNTER — Other Ambulatory Visit (HOSPITAL_COMMUNITY)
Admission: RE | Admit: 2023-03-19 | Discharge: 2023-03-19 | Disposition: A | Discharge status: Home / Self Care | Payer: Medicaid Other | Source: Ambulatory Visit | Attending: Obstetrics & Gynecology | Admitting: Obstetrics & Gynecology

## 2023-03-19 VITALS — BP 126/81 | HR 80 | Wt 139.0 lb

## 2023-03-19 DIAGNOSIS — N644 Mastodynia: Secondary | ICD-10-CM

## 2023-03-19 DIAGNOSIS — B9689 Other specified bacterial agents as the cause of diseases classified elsewhere: Secondary | ICD-10-CM

## 2023-03-19 DIAGNOSIS — N760 Acute vaginitis: Secondary | ICD-10-CM | POA: Insufficient documentation

## 2023-03-19 NOTE — Progress Notes (Signed)
   GYNECOLOGY OFFICE VISIT NOTE  History:   Sara Krueger is a 47 y.o. [id number] here today for vaginal itching and burning for 4-5 weeks.  Denies any vaginal discharge. She denies any abnormal vaginal bleeding, pelvic pain or other concerns.  No periods in last 3 months.    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   Vitamin D insufficiency 03/25/2021    Past Surgical History:  Procedure Laterality Date   LAPAROSCOPIC TUBAL LIGATION Bilateral 05/21/2018   Procedure: LAPAROSCOPIC TUBAL LIGATION;  Surgeon: Allie Bossier, MD;  Location: [facility];  Service: Gynecology;  Laterality: Bilateral;  with filshie Clips    LAPAROSCOPY N/A 05/21/2018   Procedure: LAPAROSCOPY DIAGNOSTIC;  Surgeon: Allie Bossier, MD;  Location: [facility];  Service: Gynecology;  Laterality: N/A;  removal of two filshe clips in abdomen from a previous procedure    TUBAL LIGATION  2006   Failed Tubal    The following portions of the patient's history were reviewed and updated as appropriate: allergies, current medications, past family history, past medical history, past social history, past surgical history and problem list.   Health Maintenance:  Normal pap and negative HRHPV on 05/17/2022.  Normal mammogram on 01/20/2022.   Review of Systems:  Pertinent items noted in HPI and remainder of comprehensive ROS otherwise negative.  Physical Exam:  BP 126/81   Pulse 80   Wt 139 lb (63 kg)   BMI 23.13 kg/m  CONSTITUTIONAL: Well-developed, well-nourished female in no acute distress.  HEENT:  Normocephalic, atraumatic. External right and left ear normal. No scleral icterus.  NECK: Normal range of motion, supple, no masses noted on observation SKIN: No rash noted. Not  diaphoretic. No erythema. No pallor. MUSCULOSKELETAL: Normal range of motion. No edema noted. NEUROLOGIC: Alert and oriented to person, place, and time. Normal muscle tone coordination. No cranial nerve deficit noted. PSYCHIATRIC: Normal mood and affect. Normal behavior. Normal judgment and thought content. CARDIOVASCULAR: Normal heart rate noted RESPIRATORY: Effort and breath sounds normal, no problems with respiration noted ABDOMEN: No masses noted. No other overt distention noted.   PELVIC: Normal appearing external genitalia, no erythema or lesions; normal urethral meatus; normal appearing distal vaginal mucosa.  No abnormal discharge noted.  Testing sample obtained.Performed in the presence of a chaperone     Assessment and Plan:    1. Vulvovaginitis Will follow up test results and manage accordingly. Proper vulvar hygiene emphasized: discussed avoidance of perfumed soaps, detergents, lotions and any type of douches; in addition to wearing cotton underwear and no underwear at night.  Also recommended cleaning front to back, voiding and cleaning up after intercourse.  - Cervicovaginal ancillary only( [place]) Routine preventative health maintenance measures emphasized, she will schedule mammogram soon. Please refer to After Visit Summary for other counseling recommendations.   Return for any gynecologic concerns.    I spent 25 minutes dedicated to the care of this patient including pre-visit review of records, face to face time with the patient discussing her conditions and treatments and post visit orders.    Jaynie Collins, MD, FACOG Obstetrician & Gynecologist, [facility] for [employer], [facility]

## 2023-03-20 LAB — CERVICOVAGINAL ANCILLARY ONLY
Bacterial Vaginitis (gardnerella): POSITIVE — AB
Candida Glabrata: NEGATIVE
Candida Vaginitis: NEGATIVE
Chlamydia: NEGATIVE
Comment: NEGATIVE
Comment: NEGATIVE
Comment: NEGATIVE
Comment: NEGATIVE
Comment: NEGATIVE
Comment: NORMAL
Neisseria Gonorrhea: NEGATIVE
Trichomonas: NEGATIVE

## 2023-03-20 MED ORDER — METRONIDAZOLE 500 MG PO TABS
500.0000 mg | ORAL_TABLET | Freq: Two times a day (BID) | ORAL | 0 refills | Status: AC
Start: 2023-03-20 — End: 2023-03-27

## 2023-03-20 NOTE — Addendum Note (Signed)
Addended by: Jaynie Collins A on: 03/20/2023 02:21 PM   Modules accepted: Orders

## 2023-03-26 ENCOUNTER — Ambulatory Visit: Payer: Medicaid Other

## 2023-03-26 ENCOUNTER — Ambulatory Visit
Admission: RE | Admit: 2023-03-26 | Discharge: 2023-03-26 | Disposition: A | Discharge status: Home / Self Care | Payer: Medicaid Other | Source: Ambulatory Visit | Attending: Nurse Practitioner | Admitting: Nurse Practitioner

## 2023-03-26 DIAGNOSIS — N644 Mastodynia: Secondary | ICD-10-CM

## 2023-12-15 ENCOUNTER — Encounter (HOSPITAL_COMMUNITY): Payer: Self-pay | Admitting: Emergency Medicine

## 2023-12-15 ENCOUNTER — Emergency Department (HOSPITAL_COMMUNITY)
Admission: EM | Admit: 2023-12-15 | Discharge: 2023-12-16 | Disposition: A | Discharge status: Home / Self Care | Payer: Medicaid Other | Attending: Emergency Medicine | Admitting: Emergency Medicine

## 2023-12-15 ENCOUNTER — Emergency Department (HOSPITAL_COMMUNITY)
Admission: EM | Admit: 2023-12-15 | Discharge: 2023-12-15 | Discharge status: Against Medical Advice | Payer: Medicaid Other | Source: Home / Self Care

## 2023-12-15 DIAGNOSIS — R7309 Other abnormal glucose: Secondary | ICD-10-CM | POA: Insufficient documentation

## 2023-12-15 DIAGNOSIS — M549 Dorsalgia, unspecified: Secondary | ICD-10-CM | POA: Insufficient documentation

## 2023-12-15 DIAGNOSIS — Z20822 Contact with and (suspected) exposure to covid-19: Secondary | ICD-10-CM | POA: Diagnosis not present

## 2023-12-15 DIAGNOSIS — R519 Headache, unspecified: Secondary | ICD-10-CM | POA: Diagnosis present

## 2023-12-15 DIAGNOSIS — R1011 Right upper quadrant pain: Secondary | ICD-10-CM | POA: Diagnosis not present

## 2023-12-15 DIAGNOSIS — J101 Influenza due to other identified influenza virus with other respiratory manifestations: Secondary | ICD-10-CM

## 2023-12-15 LAB — CBG MONITORING, ED: Glucose-Capillary: 158 mg/dL — ABNORMAL HIGH (ref 70–99)

## 2023-12-15 LAB — URINALYSIS, ROUTINE W REFLEX MICROSCOPIC
Bilirubin Urine: NEGATIVE
Glucose, UA: NEGATIVE mg/dL
Ketones, ur: NEGATIVE mg/dL
Nitrite: NEGATIVE
Protein, ur: 30 mg/dL — AB
Specific Gravity, Urine: 1.009 (ref 1.005–1.030)
WBC, UA: >50 WBC/hpf (ref 0–5)
pH: 6 (ref 5.0–8.0)

## 2023-12-15 LAB — CBC
HCT: 43.7 % (ref 36.0–46.0)
Hemoglobin: 15.1 g/dL — ABNORMAL HIGH (ref 12.0–15.0)
MCH: 31.1 pg (ref 26.0–34.0)
MCHC: 34.6 g/dL (ref 30.0–36.0)
MCV: 90.1 fL (ref 80.0–100.0)
Platelets: 162 10*3/uL (ref 150–400)
RBC: 4.85 MIL/uL (ref 3.87–5.11)
RDW: 13.2 % (ref 11.5–15.5)
WBC: 6.8 10*3/uL (ref 4.0–10.5)
nRBC: 0 % (ref 0.0–0.2)

## 2023-12-15 LAB — COMPREHENSIVE METABOLIC PANEL
ALT: 19 U/L (ref 0–44)
AST: 26 U/L (ref 15–41)
Albumin: 3.6 g/dL (ref 3.5–5.0)
Alkaline Phosphatase: 49 U/L (ref 38–126)
Anion gap: 10 (ref 5–15)
BUN: 6 mg/dL (ref 6–20)
CO2: 26 mmol/L (ref 22–32)
Calcium: 8.9 mg/dL (ref 8.9–10.3)
Chloride: 101 mmol/L (ref 98–111)
Creatinine, Ser: 0.76 mg/dL (ref 0.44–1.00)
GFR, Estimated: >60 mL/min (ref >60)
Glucose, Bld: 115 mg/dL — ABNORMAL HIGH (ref 70–99)
Potassium: 3.8 mmol/L (ref 3.5–5.1)
Sodium: 137 mmol/L (ref 135–145)
Total Bilirubin: 0.5 mg/dL (ref 0.0–1.2)
Total Protein: 7 g/dL (ref 6.5–8.1)

## 2023-12-15 LAB — LIPASE, BLOOD: Lipase: 43 U/L (ref 11–51)

## 2023-12-15 LAB — HCG, SERUM, QUALITATIVE: Preg, Serum: NEGATIVE

## 2023-12-15 MED ORDER — KETOROLAC TROMETHAMINE 15 MG/ML IJ SOLN
15.0000 mg | Freq: Once | INTRAMUSCULAR | Status: AC
  Administered 2023-12-15: 15 mg via INTRAVENOUS
  Filled 2023-12-15: qty 1

## 2023-12-15 MED ORDER — SODIUM CHLORIDE 0.9 % IV BOLUS (SEPSIS)
500.0000 mL | Freq: Once | INTRAVENOUS | Status: AC
  Administered 2023-12-15: 500 mL via INTRAVENOUS

## 2023-12-15 MED ORDER — ONDANSETRON HCL 4 MG/2ML IJ SOLN
4.0000 mg | Freq: Once | INTRAMUSCULAR | Status: AC
  Administered 2023-12-15: 4 mg via INTRAVENOUS
  Filled 2023-12-15: qty 2

## 2023-12-15 NOTE — ED Triage Notes (Signed)
Pt complains of abd pain and vomiting x 3 days. Denies diarrhea

## 2023-12-15 NOTE — ED Provider Notes (Signed)
[place] EMERGENCY DEPARTMENT AT [facility] Provider Note   CSN: [id number] Arrival date & time: 12/15/23  2048     History {Add pertinent medical, surgical, social history, OB history to HPI:1} Chief Complaint  Patient presents with   Abdominal Pain    Sara Krueger is a 48 y.o. female.  The history is provided by the patient.  patient history of constipation presents for multiple complaints.  Patient reports over 2 days ago she started having fever and headache.  She also reports diffuse body pain and back pain.  She also reports abdominal pain and vomiting.  She reports cough and congestion.  No chest pain or shortness of breath.  No diarrhea. No sick contacts.  No urinary symptoms   Patient initially refused to speak and had family give the history. 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 Authorizing Provider  hydrocortisone (ANUSOL-HC) 2.5 % rectal cream Place 1 Application rectally 2 (two) times daily. 06/12/22   Imogene Burn, MD      Allergies    Patient has no known allergies.    Review of Systems   Review of Systems  Constitutional:  Positive for fatigue and fever.  Respiratory:  Positive for cough.   Musculoskeletal:  Positive for myalgias.    Physical Exam Updated Vital Signs BP 127/78 (BP Location: Left Arm)   Pulse 75   Temp 100.1 F (37.8 C) (Oral)   Resp 16   LMP 11/17/2023   SpO2 98%  Physical Exam CONSTITUTIONAL: Well developed/well nourished, anxious HEAD:  Normocephalic/atraumatic EYES: EOMI/PERRL ENMT: Mucous membranes moist, stridor no drooling NECK: supple no meningeal signs CV: S1/S2 noted, no murmurs/rubs/gallops noted LUNGS: Lungs are clear to auscultation bilaterally, no apparent distress ABDOMEN: soft, diffuse mild upper abdominal tenderness, no rebound or guarding, bowel sounds noted throughout abdomen GU:no cva tenderness NEURO: Pt is awake/alert/appropriate, moves all extremitiesx4.  No facial droop.   EXTREMITIES: pulses normal/equal, full ROM SKIN: warm, color normal PSYCH: Anxious, patient tapping her abdomen repetitively  ED Results / Procedures / Treatments   Labs (all labs ordered are listed, but only abnormal results are displayed) Labs Reviewed  COMPREHENSIVE METABOLIC PANEL - Abnormal; Notable for the following components:      Result Value   Glucose, Bld 115 (*)    All other components within normal limits  CBC - Abnormal; Notable for the following components:   Hemoglobin 15.1 (*)    All other components within normal limits  URINALYSIS, ROUTINE W REFLEX MICROSCOPIC - Abnormal; Notable for the following components:   APPearance HAZY (*)    Hgb urine dipstick SMALL (*)    Protein, ur 30 (*)    Leukocytes,Ua LARGE (*)    Bacteria, UA RARE (*)    All other components within normal limits  CBG MONITORING, ED - Abnormal; Notable for the following components:   Glucose-Capillary 158 (*)    All other components within normal limits  RESP PANEL BY RT-PCR (RSV, FLU A&B, COVID)  RVPGX2  URINE  CULTURE  LIPASE, BLOOD  HCG, SERUM, QUALITATIVE    EKG None  Radiology No results found.  Procedures Procedures  {Document cardiac monitor, telemetry assessment procedure when appropriate:1}  Medications Ordered in ED Medications  sodium chloride 0.9 % bolus 500 mL (500 mLs Intravenous New Bag/Given 12/15/23 2329)  ondansetron (ZOFRAN) injection 4 mg (4 mg Intravenous Given 12/15/23 2328)  ketorolac (TORADOL) 15 MG/ML  injection 15 mg (15 mg Intravenous Given 12/15/23 2328)    ED Course/ Medical Decision Making/ A&P   {   Click here for ABCD2, HEART and other calculatorsREFRESH Note before signing :1}                              Medical Decision Making Amount and/or Complexity of Data Reviewed Labs: ordered.  Risk Prescription drug management.   This patient presents to the ED for concern of abdominal pain, this involves an extensive number of treatment options, and is a complaint that carries with it a high risk of complications and morbidity.  The differential diagnosis includes but is not limited to cholecystitis, cholelithiasis, pancreatitis, gastritis, peptic ulcer disease, appendicitis, bowel obstruction, bowel perforation, diverticulitis, AAA, ischemic bowel, viral illness, UTI, ectopic pregnancy, TOA, PID    Comorbidities that complicate the patient evaluation: Patient's presentation is complicated by their history of depression  Social Determinants of Health: Patient's  anxiety   increases the complexity of managing their presentation  Additional history obtained: Additional history obtained from family Records reviewed Care Everywhere/External Records  Lab Tests: I Ordered, and personally interpreted labs.  The pertinent results include: mild Hyperglycemia  Imaging Studies ordered: I ordered imaging studies including {imaging:26848}  I independently visualized and interpreted imaging which showed *** I agree with the radiologist interpretation  Cardiac Monitoring: The patient was maintained on a cardiac monitor.  I personally viewed and interpreted the cardiac monitor which showed an underlying rhythm of:  {cardiac monitor:26849}  Medicines ordered and prescription drug management: I ordered medication including Toradol for pain Reevaluation of the patient after these medicines showed that the patient    {resolved/improved/worsened:23923::"improved"}  Test Considered: Patient  is low risk / negative by ***, therefore do not feel that *** is indicated.  Critical Interventions:  ***  Consultations Obtained: I requested consultation with the {consultation:26851}, and discussed  findings as well as pertinent plan - they recommend: ***  Reevaluation: After the interventions noted above, I reevaluated the patient and found that they have :{resolved/improved/worsened:23923::"improved"}  Complexity of problems addressed: Patient's presentation is most consistent with  {ZOXW:96045}  Disposition: After consideration of the diagnostic results and the patient's response to treatment,  I feel that the patent would benefit from {disposition:26850}.     {Document critical care time when appropriate:1} {Document review of labs and clinical decision tools ie heart score, Chads2Vasc2 etc:1}  {Document your independent review of radiology images, and any outside records:1} {Document your discussion with family members, caretakers, and with consultants:1} {Document social determinants of health affecting pt's care:1} {Document your decision making why or why not admission, treatments were needed:1} Final Clinical Impression(s) / ED Diagnoses Final diagnoses:  None    Rx / DC Orders ED Discharge Orders     None

## 2023-12-16 ENCOUNTER — Emergency Department (HOSPITAL_COMMUNITY): Payer: Medicaid Other

## 2023-12-16 LAB — URINE CULTURE: Culture: NO GROWTH

## 2023-12-16 LAB — RESP PANEL BY RT-PCR (RSV, FLU A&B, COVID)  RVPGX2
Influenza A by PCR: POSITIVE — AB
Influenza B by PCR: NEGATIVE
Resp Syncytial Virus by PCR: NEGATIVE
SARS Coronavirus 2 by RT PCR: NEGATIVE

## 2023-12-16 MED ORDER — FENTANYL CITRATE PF 50 MCG/ML IJ SOSY
50.0000 ug | PREFILLED_SYRINGE | Freq: Once | INTRAMUSCULAR | Status: AC
  Administered 2023-12-16: 50 ug via INTRAVENOUS
  Filled 2023-12-16: qty 1

## 2023-12-17 ENCOUNTER — Telehealth: Payer: Self-pay

## 2023-12-17 NOTE — Transitions of Care (Post Inpatient/ED Visit) (Signed)
   12/17/2023  Name: Sara Krueger MRN: [id number] DOB: June 19, 1976  Today's TOC FU Call Status: Today's TOC FU Call Status:: Unsuccessful Call (1st Attempt) Unsuccessful Call (1st Attempt) Date: 12/17/23  Attempted to reach the patient regarding the most recent Inpatient/ED visit.  Follow Up Plan: Additional outreach attempts will be made to reach the patient to complete the Transitions of Care (Post Inpatient/ED visit) call.   Signature Jodelle Green, RMA

## 2023-12-18 NOTE — Addendum Note (Signed)
Addended by: Leroy Kennedy on: 12/18/2023 02:19 PM   Modules accepted: Orders

## 2023-12-18 NOTE — Transitions of Care (Post Inpatient/ED Visit) (Signed)
   12/18/2023  Name: Sara Krueger MRN: [id number] DOB: 07-16-1976  Today's TOC FU Call Status: Today's TOC FU Call Status:: Successful TOC FU Call Completed Unsuccessful Call (1st Attempt) Date: 12/17/23 [name] FU Call Complete Date: 12/18/23 Patient's Name and Date of Birth confirmed.  Transition Care Management Follow-up Telephone Call Date of Discharge: 12/15/23 Discharge Facility: Sara Krueger [name]) Type of Discharge: Emergency Department Reason for ED Visit:  (abdominal pain, cough and congestion) How have you been since you were released from the hospital?: Same Any questions or concerns?: Yes Patient Questions/Concerns:: patient is experiencing emesis with cough and congestion Patient Questions/Concerns Addressed: Provided Patient Educational Materials  Items Reviewed: Did you receive and understand the discharge instructions provided?: Yes Medications obtained,verified, and reconciled?: Yes (Medications Reviewed) Any new allergies since your discharge?: No Dietary orders reviewed?: No Do you have support at home?: No  Medications Reviewed Today: Medications Reviewed Today     Reviewed by Eugenie Ulanda CROME, CMA (Certified Medical Assistant) on 12/18/23 at 1419  Med List Status: <None>   Medication Order Taking? Sig Documenting Provider Last Dose Status Informant  hydrocortisone  (ANUSOL -HC) 2.5 % rectal cream [id number] Yes Place 1 Application rectally 2 (two) times daily. Federico Rosario BROCKS, MD Taking Active   meloxicam [facility]) 15 MG tablet [id number] Yes Take 1 tablet by mouth daily. [provider] Taking Active   mirtazapine (REMERON) 15 MG tablet [id number] Yes Take 15 mg by mouth at bedtime. [provider] Taking Active   sertraline (ZOLOFT) 25 MG tablet [id number] Yes Take 1 tablet by mouth daily. [provider] Taking Active             Home Care and Equipment/Supplies: Were Home Health Services Ordered?: No Any new equipment or medical  supplies ordered?: No  Functional Questionnaire: Do you need assistance with bathing/showering or dressing?: No Do you need assistance with meal preparation?: No Do you need assistance with eating?: No Do you have difficulty maintaining continence: No Do you need assistance with getting out of bed/getting out of a chair/moving?: No Do you have difficulty managing or taking your medications?: No  Follow up appointments reviewed: PCP Follow-up appointment confirmed?: Yes Date of PCP follow-up appointment?: 01/01/24 Follow-up Provider: Roselie Mood NP Specialist Hospital Follow-up appointment confirmed?: No Reason Specialist Follow-Up Not Confirmed: Appointment Sceduled by [name] Calling Clinician Do you need transportation to your follow-up appointment?: No Do you understand care options if your condition(s) worsen?: Yes-patient verbalized understanding    SIGNATURE Ulanda Eugenie, RMA

## 2023-12-31 NOTE — Telephone Encounter (Signed)
FYI - pt has future visit at [facility] 01/03/2024

## 2024-01-01 ENCOUNTER — Inpatient Hospital Stay: Payer: Medicaid Other | Admitting: Nurse Practitioner

## 2024-01-01 ENCOUNTER — Telehealth: Payer: Self-pay | Admitting: Nurse Practitioner

## 2024-01-08 NOTE — Telephone Encounter (Signed)
 Pt has PCP listed at [facility], FNP    01/01/2024 no show Do you see that I need to remove you as PCP?

## 2024-02-15 ENCOUNTER — Ambulatory Visit: Admitting: Obstetrics & Gynecology

## 2024-02-15 ENCOUNTER — Other Ambulatory Visit (HOSPITAL_COMMUNITY)
Admission: RE | Admit: 2024-02-15 | Discharge: 2024-02-15 | Disposition: A | Discharge status: Home / Self Care | Source: Ambulatory Visit | Attending: Obstetrics & Gynecology | Admitting: Obstetrics & Gynecology

## 2024-02-15 ENCOUNTER — Encounter: Payer: Self-pay | Admitting: Obstetrics & Gynecology

## 2024-02-15 VITALS — BP 113/74 | HR 75 | Ht 65.0 in | Wt 132.0 lb

## 2024-02-15 DIAGNOSIS — K5904 Chronic idiopathic constipation: Secondary | ICD-10-CM | POA: Diagnosis not present

## 2024-02-15 DIAGNOSIS — R1032 Left lower quadrant pain: Secondary | ICD-10-CM | POA: Insufficient documentation

## 2024-02-15 DIAGNOSIS — Z01419 Encounter for gynecological examination (general) (routine) without abnormal findings: Secondary | ICD-10-CM

## 2024-02-15 DIAGNOSIS — N760 Acute vaginitis: Secondary | ICD-10-CM | POA: Diagnosis not present

## 2024-02-15 DIAGNOSIS — B9689 Other specified bacterial agents as the cause of diseases classified elsewhere: Secondary | ICD-10-CM

## 2024-02-15 DIAGNOSIS — K649 Unspecified hemorrhoids: Secondary | ICD-10-CM

## 2024-02-15 DIAGNOSIS — Z1231 Encounter for screening mammogram for malignant neoplasm of breast: Secondary | ICD-10-CM

## 2024-02-15 DIAGNOSIS — Z1339 Encounter for screening examination for other mental health and behavioral disorders: Secondary | ICD-10-CM

## 2024-02-15 DIAGNOSIS — N83202 Unspecified ovarian cyst, left side: Secondary | ICD-10-CM

## 2024-02-15 NOTE — Progress Notes (Signed)
 GYNECOLOGY ANNUAL PREVENTATIVE CARE ENCOUNTER NOTE  History:     Sara Krueger is a 48 y.o. [phone] female here for a routine annual gynecologic exam.  Current complaints: left sided lower abdominal pain that has been going on for two months. No periods since September, thinks she is going through menopause, no concerning vasomotor symptoms.  Also had upper abdominal pain, had negative evaluation in ER in February.  Also reports chronic constipation and symptomatic hemorrhoids, desires referral to gastroenterology.   Denies abnormal vaginal bleeding, discharge, pelvic pain, problems with intercourse or other gynecologic concerns.    Gynecologic History No LMP recorded. Contraception: tubal ligation Last Pap: 06/06/2022. Result was normal with negative HPV Last Mammogram: 03/26/2023.  Result was normal Last Colonoscopy: 06/12/2022.  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   Vitamin D insufficiency 03/25/2021    Past Surgical History:  Procedure Laterality Date   LAPAROSCOPIC TUBAL LIGATION Bilateral 05/21/2018   Procedure: LAPAROSCOPIC TUBAL LIGATION;  Surgeon: Allie Bossier, MD;  Location: [facility];  Service: Gynecology;  Laterality: Bilateral;  with filshie Clips    LAPAROSCOPY N/A 05/21/2018   Procedure: LAPAROSCOPY DIAGNOSTIC;  Surgeon: Allie Bossier, MD;  Location: [facility];  Service: Gynecology;  Laterality: N/A;  removal of two filshe  clips in abdomen from a previous procedure    TUBAL LIGATION  2006   Failed Tubal    Current Outpatient Medications on File Prior to Visit  Medication Sig Dispense Refill   hydrocortisone (ANUSOL-HC) 2.5 % rectal cream Place 1 Application rectally 2 (two) times daily. (Patient not taking: Reported on 02/15/2024) 30 g 1   meloxicam (MOBIC) 15 MG tablet Take 1 tablet by mouth daily. (Patient not taking: Reported on 02/15/2024)     mirtazapine (REMERON) 15 MG tablet Take 15 mg by mouth at bedtime. (Patient not taking: Reported on 02/15/2024)     sertraline (ZOLOFT) 25 MG tablet Take 1 tablet by mouth daily. (Patient not taking: Reported on 02/15/2024)     No current facility-administered medications on file prior to visit.    No Known Allergies  Social History:  reports that she has never smoked. She has never used smokeless tobacco. She reports that she does not drink alcohol and does not use drugs.  Family History  Problem Relation Age of Onset   Heart disease Father    Hyperlipidemia Father    Breast cancer Paternal Aunt 30    The following portions of the patient's history were reviewed and updated as appropriate: allergies, current medications, past family history, past medical history, past social history, past surgical history and problem list.  Review of  Systems Pertinent items noted in HPI and remainder of comprehensive ROS otherwise negative.  Physical Exam:  BP 113/74   Pulse 75   Ht 5\' 5"  (1.651 m)   Wt 132 lb (59.9 kg)   BMI 21.97 kg/m  CONSTITUTIONAL: Well-developed, well-nourished female in no acute distress.  HENT:  Normocephalic, atraumatic, External right and left ear normal.  EYES: Conjunctivae and EOM are normal. Pupils are equal, round, and reactive to light. No scleral icterus.  NECK: Normal range of motion, supple, no masses observed. SKIN: Skin is warm and dry. No rash noted. Not diaphoretic. No erythema. No pallor. MUSCULOSKELETAL: Normal range of motion. No  tenderness.  No cyanosis, clubbing, or edema. NEUROLOGIC: Alert and oriented to person, place, and time. Normal muscle tone coordination.  PSYCHIATRIC: Normal mood and affect. Normal behavior. Normal judgment and thought content. CARDIOVASCULAR: Normal heart rate noted, regular rhythm RESPIRATORY: Clear to auscultation bilaterally. Effort and breath sounds normal, no problems with respiration noted. BREASTS: Symmetric in size. No masses, tenderness, skin changes, nipple drainage, or lymphadenopathy bilaterally. Performed in the presence of a chaperone. ABDOMEN: Soft, no distention noted.  +Moderate LLQ tenderness, no rebound or guarding.  PELVIC: Normal appearing external genitalia and urethral meatus; normal appearing vaginal mucosa and cervix.  Copious light yellow vaginal discharge noted, testing sample obtained.  Pap smear obtained.  Normal uterine size, no other palpable masses, no uterine tenderness but moderate left adnexal tenderness.  Performed in the presence of a chaperone.   Assessment and Plan:     1. Chronic idiopathic constipation Referred to Gastroenterology and given information with strategies that could help with this.  - Ambulatory referral to Gastroenterology  2. Hemorrhoids, unspecified hemorrhoid type Referred to Gastroenterology and given information with strategies that could help with this.  - Ambulatory referral to Gastroenterology  3. Left lower quadrant abdominal pain Unsure etiology for pain, possible it is linked to her chronic constipation but she did have moderate adnexal tenderness on examination. Pelvic ultrasound ordered, will follow up results and manage accordingly. OTC NSAIDs recommended as needed for pain. - US PELVIC COMPLETE WITH TRANSVAGINAL; Future - Cervicovaginal ancillary only  4. Breast cancer screening by mammogram Mammogram scheduled for breast cancer screening. - MM 3D SCREENING MAMMOGRAM BILATERAL BREAST; Future  5. Well woman exam with  routine gynecological exam (Primary) - Cytology - PAP Will follow up results of pap smear and manage accordingly. Colon cancer screening is up to date. Routine preventative health maintenance measures emphasized. Please refer to After Visit Summary for other counseling recommendations.      Jaynie Collins, MD, FACOG Obstetrician & Gynecologist, [facility] for [employer], [facility]

## 2024-02-18 ENCOUNTER — Encounter: Payer: Self-pay | Admitting: Obstetrics & Gynecology

## 2024-02-18 LAB — CYTOLOGY - PAP
Comment: NEGATIVE
Diagnosis: NEGATIVE
High risk HPV: NEGATIVE

## 2024-02-18 LAB — CERVICOVAGINAL ANCILLARY ONLY
Bacterial Vaginitis (gardnerella): POSITIVE — AB
Candida Glabrata: NEGATIVE
Candida Vaginitis: NEGATIVE
Chlamydia: NEGATIVE
Comment: NEGATIVE
Comment: NEGATIVE
Comment: NEGATIVE
Comment: NEGATIVE
Comment: NEGATIVE
Comment: NORMAL
Neisseria Gonorrhea: NEGATIVE
Trichomonas: NEGATIVE

## 2024-02-18 MED ORDER — METRONIDAZOLE 500 MG PO TABS
500.0000 mg | ORAL_TABLET | Freq: Two times a day (BID) | ORAL | 0 refills | Status: AC
Start: 2024-02-18 — End: 2024-02-25

## 2024-02-18 NOTE — Addendum Note (Signed)
 Addended by: Jaynie Collins A on: 02/18/2024 01:41 PM   Modules accepted: Orders

## 2024-02-19 ENCOUNTER — Ambulatory Visit (HOSPITAL_COMMUNITY)

## 2024-02-26 ENCOUNTER — Ambulatory Visit

## 2024-02-26 ENCOUNTER — Ambulatory Visit (HOSPITAL_COMMUNITY)
Admission: RE | Admit: 2024-02-26 | Discharge: 2024-02-26 | Disposition: A | Discharge status: Home / Self Care | Source: Ambulatory Visit | Attending: Obstetrics & Gynecology | Admitting: Obstetrics & Gynecology

## 2024-02-26 DIAGNOSIS — R1032 Left lower quadrant pain: Secondary | ICD-10-CM | POA: Insufficient documentation

## 2024-02-28 ENCOUNTER — Encounter: Payer: Self-pay | Admitting: Obstetrics & Gynecology

## 2024-02-28 NOTE — Progress Notes (Signed)
 Left ovarian cysts noted, no concerning characteristic seen.  However, given persistence of symptoms and her age, need to check tumor markers, she can come in next week for this, future orders placed.  This is not an emergency, can be done any time next week.   Tried to call patient, got voicemail and asked her to read MyChart message. If pain worsens or if fevers, nausea and vomiting develop or other worsening symptoms, she was advised to go to ER. For now, will continue to monitor symptoms and follow up tumor marker results.  Further evaluation will depend on results and patient's symptoms.    Lenoard Rad, MD

## 2024-02-28 NOTE — Addendum Note (Signed)
 Addended by: Lenoard Rad A on: 02/28/2024 11:44 AM   Modules accepted: Orders

## 2024-03-05 ENCOUNTER — Other Ambulatory Visit

## 2024-03-05 DIAGNOSIS — N83202 Unspecified ovarian cyst, left side: Secondary | ICD-10-CM

## 2024-03-06 ENCOUNTER — Encounter: Payer: Self-pay | Admitting: Obstetrics & Gynecology

## 2024-03-06 LAB — OVARIAN MALIGNANCY RISK-ROMA
Cancer Antigen (CA) 125: 7 U/mL (ref 0.0–38.1)
HE4: 45.1 pmol/L (ref 0.0–63.6)
Postmenopausal ROMA: 0.63
Premenopausal ROMA: 0.57

## 2024-03-06 LAB — POSTMENOPAUSAL INTERP: LOW

## 2024-03-06 LAB — PREMENOPAUSAL INTERP: LOW

## 2024-03-19 ENCOUNTER — Encounter (HOSPITAL_COMMUNITY): Payer: Self-pay

## 2024-03-26 ENCOUNTER — Other Ambulatory Visit: Payer: Self-pay | Admitting: Medical Genetics

## 2024-03-26 ENCOUNTER — Ambulatory Visit
Admission: RE | Admit: 2024-03-26 | Discharge: 2024-03-26 | Disposition: A | Discharge status: Home / Self Care | Source: Ambulatory Visit | Attending: Obstetrics & Gynecology | Admitting: Obstetrics & Gynecology

## 2024-03-26 DIAGNOSIS — Z1231 Encounter for screening mammogram for malignant neoplasm of breast: Secondary | ICD-10-CM

## 2024-03-31 ENCOUNTER — Ambulatory Visit: Payer: Self-pay | Admitting: Obstetrics & Gynecology

## 2024-04-02 ENCOUNTER — Encounter: Payer: Self-pay | Admitting: Gastroenterology

## 2024-04-02 ENCOUNTER — Ambulatory Visit: Admitting: Gastroenterology

## 2024-04-02 VITALS — BP 104/66 | HR 76 | Ht 66.0 in | Wt 133.2 lb

## 2024-04-02 DIAGNOSIS — K5904 Chronic idiopathic constipation: Secondary | ICD-10-CM

## 2024-04-02 DIAGNOSIS — K602 Anal fissure, unspecified: Secondary | ICD-10-CM

## 2024-04-02 DIAGNOSIS — K6289 Other specified diseases of anus and rectum: Secondary | ICD-10-CM

## 2024-04-02 MED ORDER — DILTIAZEM GEL 2 %: 1.0000 | Freq: Three times a day (TID) | CUTANEOUS | 1 refills | Status: AC

## 2024-04-02 NOTE — Progress Notes (Signed)
 I agree with the assessment and plan as outlined by Ms. May.

## 2024-04-02 NOTE — Progress Notes (Signed)
 Chief Complaint:chronic constipation, hemorrhoids Primary GI Doctor:Dr. Rosaline Coma  HPI:  Patient is a  48  year old female patient, known to Dr. Rosaline Coma,  with past medical history of depression, chronic constipation, and hemorrhoids who presents for follow-up of constipation and rectal pain.   Last seen in GI office 02/14/22 by Dr. Rosaline Coma for constipation and hemorrhoids. Pt. Given samples of Linzess  72 mcg po daily. 06/02/22 Colonoscopy scheduled-  The examined portion of the ileum was normal.  - Non- bleeding internal hemorrhoids.  - The examination was otherwise normal.  - No specimens collected.  Interval History     Patient presents to discuss chronic constipation and rectal pain. She is currently taking OTC Senna every day and straining with every bowel movement. She has one bowel movement weekly. She will get abdominal discomfort and bloated if she does not have bowel movement. She consumes a lot of fiber in her diet and hydrates. She has trialed OTC laxatives such as Miralax with no improvement.  She states she tried the Linzess  72 mcg po daily which helped few days then no longer helped. She also complains of rectal irritation and pain. She denies bleeding. She feels something outside rectum. She used topicals prescribed last appt for hemorrhoids with no improvement.   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   Vitamin D  insufficiency 03/25/2021    Past Surgical History:  Procedure Laterality Date   LAPAROSCOPIC TUBAL LIGATION Bilateral 05/21/2018   Procedure: LAPAROSCOPIC TUBAL LIGATION;  Surgeon: Ana Balling, MD;  Location: [facility];  Service: Gynecology;  Laterality: Bilateral;  with filshie Clips    LAPAROSCOPY N/A 05/21/2018   Procedure: LAPAROSCOPY DIAGNOSTIC;  Surgeon: Ana Balling, MD;  Location: [facility];  Service: Gynecology;  Laterality: N/A;  removal of two filshe clips in abdomen from a previous procedure    TUBAL LIGATION  2006   Failed Tubal    Current Outpatient Medications  Medication Sig Dispense Refill   Multiple Vitamin (ONE-A-DAY ESSENTIAL PO) Take 1 tablet by mouth daily.     No current facility-administered medications for this visit.    Allergies as of 04/02/2024   (No Known Allergies)    Family History  Problem Relation Age of Onset   Heart disease Father    Hyperlipidemia Father    Breast cancer Paternal Aunt 80    Review of Systems:    Constitutional: No weight loss, fever, chills, weakness or fatigue HEENT: Eyes: No change in vision               Ears, Nose, Throat:  No change in hearing or congestion Skin: No rash or itching Cardiovascular: No chest pain, chest pressure or palpitations   Respiratory: No SOB or cough Gastrointestinal: See HPI and otherwise negative Genitourinary: No dysuria or change in urinary frequency Neurological: No headache, dizziness or syncope Musculoskeletal: No new muscle or joint pain Hematologic: No bleeding or bruising Psychiatric: No history of depression or anxiety    Physical Exam:  Vital signs: BP 104/66 (BP Location: Left Arm, Patient Position: Sitting,  Cuff Size: Normal)   Pulse 76   Ht 5\' 6"  (1.676 m) Comment: height measured without shoes  Wt 133 lb 4 oz (60.4 kg)   LMP 03/07/2024   BMI 21.51 kg/m   Constitutional:   Pleasant  female appears to be in NAD, Well developed, Well nourished, alert and cooperative Throat: Oral cavity and pharynx without inflammation, swelling or lesion.  Respiratory: Respirations even and unlabored. Lungs clear to auscultation bilaterally.   No wheezes, crackles, or rhonchi.   Cardiovascular: Normal S1, S2. Regular rate and rhythm. No peripheral edema, cyanosis or pallor.  Gastrointestinal:  Soft, nondistended, nontender. No rebound or guarding. Normal bowel sounds. No appreciable masses or hepatomegaly. Rectal: Normal external rectal exam, normal rectal tone, appreciated internal hemorrhoids, tender, no masses, , brown stool, hemoccult N/A Anoscopy: small anal fissure noted posteriorly Msk:  Symmetrical without gross deformities. Without edema, no deformity or joint abnormality.  Neurologic:  Alert and  oriented x4;  grossly normal neurologically.  Skin:   Dry and intact without significant lesions or rashes. Psychiatric: Oriented to person, place and time. Demonstrates good judgement and reason without abnormal affect or behaviors.  RELEVANT LABS AND IMAGING: CBC    Latest Ref Rng & Units 12/15/2023    9:36 PM 02/03/2022    9:12 AM 03/24/2021   11:54 AM  CBC  WBC 4.0 - 10.5 K/uL 6.8  4.3  6.1   Hemoglobin 12.0 - 15.0 g/dL 16.1  09.6  04.5   Hematocrit 36.0 - 46.0 % 43.7  42.2  42.2   Platelets 150 - 400 K/uL 162  166.0  158.0      CMP     Latest Ref Rng & Units 12/15/2023    9:36 PM 02/03/2022    9:12 AM 03/24/2021   11:54 AM  CMP  Glucose 70 - 99 mg/dL 409  94  76   BUN 6 - 20 mg/dL 6  8  10    Creatinine 0.44 - 1.00 mg/dL 8.11  9.14  7.82   Sodium 135 - 145 mmol/L 137  141  140   Potassium 3.5 - 5.1 mmol/L 3.8  3.8  4.3   Chloride 98 - 111 mmol/L 101  107  106   CO2 22 - 32 mmol/L 26  31  29    Calcium 8.9 - 10.3 mg/dL 8.9  8.8  9.3   Total Protein 6.5 - 8.1 g/dL 7.0  6.8  7.0   Total Bilirubin 0.0 - 1.2 mg/dL 0.5  0.9  0.7   Alkaline Phos 38 - 126 U/L 49  52  50   AST 15 - 41 U/L 26  17  13    ALT 0 - 44 U/L 19  9  9       Lab Results  Component Value Date   TSH 1.52 02/03/2022     Assessment: Encounter Diagnoses  Name Primary?   Rectal pain Yes   Anal fissure    Chronic idiopathic constipation      48 year old female patient with chronic  constipation not relieved with low-dose Linzess , will provide her with higher dose 145 mcg p.o. daily.  Instructed patient to contact office if it does not help so that we can discuss other options.  Also encouraged high-fiber diet with plenty of fluid intake. Patient also had complaints of rectal pain with small anal fissure posteriorly noted on physical exam, will treat with sitz bath's and dialtizem topical with lidocaine  TID. No straining.  Plan: -Samples of Linzess  145mcg  po daily provided -Reinforced High fiber diet -Recommend sitz baths BID-TID -Prescribed dialtizem topical with lidocaine  TID for 4-6 weeks -follow-up Dr. Rosaline Coma or APP 2-3 mths  Thank you for the courtesy of this consult. Please call me with any questions or concerns.   Rally Ouch, FNP-C [facility] 04/02/2024, 9:48 AM  Cc: Jeanene Milder, FNP

## 2024-04-02 NOTE — Patient Instructions (Addendum)
 Anal fissue (tear in skin at rectum) -Continue high fiber diet - Recommend sitz baths: A sitz bath is the process of soaking the perineal area (urethra to anus) in warm water. It promotes healing of episiotomies, tears, fistulas or surgical sites. Sitz baths can provide pain relief, relaxation, and wound healing by increasing blood flow to the area and cleansing the area. -No straining, pushing with bowel movements -Can purchase squatty potty for bathroom to help with bowel movements -stool softeners as needed   Constipation - Samples of Linzess  145mcg po daily 1 tablet 30 minutes before first meal of the day- if medication works call office for prescription    _______________________________________________________  If your blood pressure at your visit was 140/90 or greater, please contact your primary care physician to follow up on this.  _______________________________________________________  If you are age 14 or older, your body mass index should be between 23-30. Your Body mass index is 21.51 kg/m. If this is out of the aforementioned range listed, please consider follow up with your Primary Care Provider.  If you are age 68 or younger, your body mass index should be between 19-25. Your Body mass index is 21.51 kg/m. If this is out of the aformentioned range listed, please consider follow up with your Primary Care Provider.   ________________________________________________________  The [facility] providers would like to encourage you to use MYCHART to communicate with providers for non-urgent requests or questions.  Due to long hold times on the telephone, sending your provider a message by [employer] may be a faster and more efficient way to get a response.  Please allow 48 business hours for a response.  Please remember that this is for non-urgent requests.  _______________________________________________________  Thank you for trusting me with your gastrointestinal care. Deanna  May, RNP

## 2024-07-08 ENCOUNTER — Ambulatory Visit: Admitting: Internal Medicine

## 2024-07-30 ENCOUNTER — Ambulatory Visit: Admitting: Gastroenterology

## 2024-07-30 ENCOUNTER — Encounter: Payer: Self-pay | Admitting: Gastroenterology

## 2024-07-30 VITALS — BP 116/72 | Ht 65.0 in | Wt 133.6 lb

## 2024-07-30 DIAGNOSIS — K649 Unspecified hemorrhoids: Secondary | ICD-10-CM | POA: Diagnosis not present

## 2024-07-30 DIAGNOSIS — R1010 Upper abdominal pain, unspecified: Secondary | ICD-10-CM

## 2024-07-30 DIAGNOSIS — R634 Abnormal weight loss: Secondary | ICD-10-CM | POA: Diagnosis not present

## 2024-07-30 DIAGNOSIS — K5904 Chronic idiopathic constipation: Secondary | ICD-10-CM | POA: Diagnosis not present

## 2024-07-30 DIAGNOSIS — L290 Pruritus ani: Secondary | ICD-10-CM

## 2024-07-30 DIAGNOSIS — K648 Other hemorrhoids: Secondary | ICD-10-CM

## 2024-07-30 MED ORDER — LINACLOTIDE 145 MCG PO CAPS: 145.0000 ug | ORAL_CAPSULE | Freq: Every day | ORAL | 3 refills | Status: AC

## 2024-07-30 MED ORDER — HYDROCORTISONE ACETATE 25 MG RE SUPP: 25.0000 mg | Freq: Every evening | RECTAL | 2 refills | Status: DC

## 2024-07-30 NOTE — Progress Notes (Addendum)
 Chief Complaint:follow-up, chronic constipation, anal fissure Primary GI Doctor:Dr. Federico  HPI:  Patient is a  48  year old female patient, known to Dr. Federico,  with past medical history of depression, chronic constipation, and hemorrhoids who presents for follow-up of constipation and anal fissure.  --GI office visit with myself. Linzess  145mcg samples given. Treatment given for anal fissure. --GI office 02/14/22 by Dr. Federico for constipation and hemorrhoids. Pt. Given samples of Linzess  72 mcg po daily.  06/02/22 Colonoscopy, recall 10 years  The examined portion of the ileum was normal.  - Non- bleeding internal hemorrhoids.  - The examination was otherwise normal.  - No specimens collected.  Interval History   Patient presents for follow-up. She has chronic constipation and currently taking OTC Senna twice a week. If she does not take anything she Cosby Proby go week or more without BM. She follows very strict high fiber diet and very active. At our last appointment I gave her samples of Linzess  145 mcg po daily, which she states worked well. She would like prescription.   Patient was also treated for anal fissure with dialtizem topical with lidocaine  which resolved the issue. Today she complains more of rectal itching from her internal hemorrhoids. No blood in stool.    Lastly, patient has had chronic upper abdominal pain she describes as needles poking me for at least the past year. No known food triggers. No NSAID use. No symptoms of GERD. She notes when she worries more it causes it to increase.  No belching.  With physical examination I note she does have a binder on she states for her back. She complains of lower back pain and reports she sits a lot at work. She notes numbness at times in lower extremities.   She walks and does 10 mins of cardio most days. She does body weight exercises.  She has concerns she cannot gain weight. She eats a lot of lean protein with fruits and vegetables.  She reports her normal weight is close to 135lbs.    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   Vitamin D  insufficiency 03/25/2021    Past Surgical History:  Procedure Laterality Date   LAPAROSCOPIC TUBAL LIGATION Bilateral 05/21/2018   Procedure: LAPAROSCOPIC TUBAL LIGATION;  Surgeon: Starla Harland BROCKS, MD;  Location: [facility];  Service: Gynecology;  Laterality: Bilateral;  with filshie Clips    LAPAROSCOPY N/A 05/21/2018   Procedure: LAPAROSCOPY DIAGNOSTIC;  Surgeon: Starla Harland BROCKS, MD;  Location: [facility];  Service: Gynecology;  Laterality: N/A;  removal of two filshe clips in abdomen from a previous procedure    TUBAL LIGATION  2006   Failed Tubal    Current Outpatient Medications  Medication Sig Dispense Refill   hydrocortisone  (ANUSOL -HC) 25 MG suppository Place 1 suppository (25 mg total) rectally at bedtime. 10 suppository 2   linaclotide  (LINZESS ) 145 MCG CAPS capsule Take 1 capsule (145 mcg total) by mouth daily before breakfast. 90 capsule 3   Multiple Vitamin (ONE-A-DAY ESSENTIAL PO) Take 1 tablet by mouth daily.  diltiazem  2 % GEL Apply 1 Application topically 3 (three) times daily. (Patient not taking: Reported on 07/30/2024) 30 g 1   No current facility-administered medications for this visit.    Allergies as of 07/30/2024   (No Known Allergies)    Family History  Problem Relation Age of Onset   Heart disease Father    Hyperlipidemia Father    Breast cancer Paternal Aunt 57    Review of Systems:    Constitutional: No weight loss, fever, chills, weakness or fatigue HEENT: Eyes: No  change in vision               Ears, Nose, Throat:  No change in hearing or congestion Skin: No rash or itching Cardiovascular: No chest pain, chest pressure or palpitations   Respiratory: No SOB or cough Gastrointestinal: See HPI and otherwise negative Genitourinary: No dysuria or change in urinary frequency Neurological: No headache, dizziness or syncope Musculoskeletal: No new muscle or joint pain Hematologic: No bleeding or bruising Psychiatric: No history of depression or anxiety    Physical Exam:  Vital signs: BP 116/72   Ht 5' 5 (1.651 m)   Wt 133 lb 9.6 oz (60.6 kg)   BMI 22.23 kg/m   Constitutional:   Pleasant  female appears to be in NAD, Well developed, Well nourished, alert and cooperative Throat: Oral cavity and pharynx without inflammation, swelling or lesion.  Respiratory: Respirations even and unlabored. Lungs clear to auscultation bilaterally.   No wheezes, crackles, or rhonchi.  Cardiovascular: Normal S1, S2. Regular rate and rhythm. No peripheral edema, cyanosis or pallor.  Gastrointestinal:  Soft, nondistended, nontender. No rebound or guarding. Normal bowel sounds. No appreciable masses or hepatomegaly. Binder noted. Rectal: Declined Msk:  Symmetrical without gross deformities. Without edema, no deformity or joint abnormality.  Neurologic:  Alert and  oriented x4;  grossly normal neurologically.  Skin:   Dry and intact without significant lesions or rashes. Psychiatric: Oriented to person, place and time. Demonstrates good judgement and reason without abnormal affect or behaviors.  RELEVANT LABS AND IMAGING: CBC    Latest Ref Rng & Units 12/15/2023    9:36 PM 02/03/2022    9:12 AM 03/24/2021   11:54 AM  CBC  WBC 4.0 - 10.5 K/uL 6.8  4.3  6.1   Hemoglobin 12.0 - 15.0 g/dL 84.8  85.4  85.3   Hematocrit 36.0 - 46.0 % 43.7  42.2  42.2   Platelets 150 - 400 K/uL 162  166.0  158.0      CMP     Latest Ref Rng & Units 12/15/2023    9:36 PM 02/03/2022    9:12  AM 03/24/2021   11:54 AM  CMP  Glucose 70 - 99 mg/dL 884  94  76   BUN 6 - 20 mg/dL 6  8  10    Creatinine 0.44 - 1.00 mg/dL 9.23  9.34  9.35   Sodium 135 - 145 mmol/L 137  141  140   Potassium 3.5 - 5.1 mmol/L 3.8  3.8  4.3   Chloride 98 - 111 mmol/L 101  107  106   CO2 22 - 32 mmol/L 26  31  29    Calcium 8.9 - 10.3 mg/dL 8.9  8.8  9.3   Total Protein 6.5 - 8.1 g/dL 7.0  6.8  7.0   Total Bilirubin 0.0 - 1.2 mg/dL 0.5  0.9  0.7   Alkaline Phos 38 - 126 U/L 49  52  50  AST 15 - 41 U/L 26  17  13    ALT 0 - 44 U/L 19  9  9       Lab Results  Component Value Date   TSH 1.52 02/03/2022     Assessment: Encounter Diagnoses  Name Primary?   Chronic idiopathic constipation Yes   Pruritus ani    Internal hemorrhoids    Pain of upper abdomen    Loss of weight     #23   47 year old female patient with chronic constipation not relieved with high fiber diet, OTC laxatives, stool softeners, or low-dose Linzess . She responded well to Linzess  145mcg po daily. Will send prescription.  #2  For the pruritus ani I suspect this is related to the internal hemorrhoids and will go ahead and prescribe her Anusol  suppositories.  We also discussed body hygiene.  She declined rectal exam today. # 3 Anal fissure-resolved #4  Upper abdominal pain I suspect is most likely MSK in nature.  Abdominal ultrasound February 2025 negative.  Patient does not present with any upper GI symptoms.  Will provide samples of FD guard to see if this helps if functional dyspepsia? Patient has concerns she has something else going on we will go ahead and order CT abdomen pelvis to rule out acute cause.  If negative exam recommend she follow-up with her PCP about her current back issues since she does have lower extremity numbness. We discussed how these issues can radiate to the abdomen. She Rashied Corallo try salonpas patches and standing desk for work until then. #5 UTD on colon screening, next due 7/33  Plan: -Sent RX for Linzess  145mcg  po daily provided -Order CTAP to r/o ischemic colitis -Reinforced High fiber diet -Educated on body hygiene  -Prescribe anusol  suppository at bedtime -Samples of Fdgard with meals  -Recommend talking with PCP about back pain and numbness in lower extremities -salonpas patches  prn back  Thank you for the courtesy of this consult. Please call me with any questions or concerns.   Darrion Macaulay, FNP-C [facility] 07/30/2024, 10:05 AM  Cc: Katrinka Duwaine LABOR, FNP

## 2024-07-30 NOTE — Patient Instructions (Signed)
 Back pain Can use over the counter salon pas patch Recommend standing desk Discuss with PCP about consider back imaging and/or physical therapy   Upper abdominal pain Over the counter Fdgard 2 capsules with meals CT scan abdomen/pelvis  Hemorrhoids/anal itching Keep area clean and dry Anusol  suppository at bedtime No straining  Constipation Prescription for Linzess  sent to [facility], if problems getting medication please contact office  You have been scheduled for a CT scan of the abdomen and pelvis at [facility], 1st floor Radiology. You are scheduled on 08/05/24 at 1:30pm. You should arrive 15 minutes prior to your appointment time for registration.    You may take any medications as prescribed with a small amount of water, if necessary. If you take any of the following medications: METFORMIN, GLUCOPHAGE, GLUCOVANCE, AVANDAMET, RIOMET, FORTAMET, ACTOPLUS MET, JANUMET, GLUMETZA or METAGLIP, you MAY be asked to HOLD this medication 48 hours AFTER the exam.   The purpose of you drinking the oral contrast is to aid in the visualization of your intestinal tract. The contrast solution may cause some diarrhea. Depending on your individual set of symptoms, you may also receive an intravenous injection of x-ray contrast/dye. Plan on being at [facility] for 45 minutes or longer, depending on the type of exam you are having performed.   If you have any questions regarding your exam or if you need to reschedule, you may call [facility] at [phone] between the hours of 8:00 am and 5:00 pm, Monday-Friday.    _______________________________________________________  If your blood pressure at your visit was 140/90 or greater, please contact your primary care physician to follow up on this.  _______________________________________________________  If you are age 49 or older, your body mass index should be between 23-30. Your Body mass index is 22.23 kg/m. If this is out of  the aforementioned range listed, please consider follow up with your Primary Care Provider.  If you are age 77 or younger, your body mass index should be between 19-25. Your Body mass index is 22.23 kg/m. If this is out of the aformentioned range listed, please consider follow up with your Primary Care Provider.   ________________________________________________________  The [facility] providers would like to encourage you to use MYCHART to communicate with providers for non-urgent requests or questions.  Due to long hold times on the telephone, sending your provider a message by [facility] may be a faster and more efficient way to get a response.  Please allow 48 business hours for a response.  Please remember that this is for non-urgent requests.  _______________________________________________________  [facility] is using a team-based approach to care.  Your team is made up of your doctor and two to three APPS. Our APPS (Nurse Practitioners and Physician Assistants) work with your physician to ensure care continuity for you. They are fully qualified to address your health concerns and develop a treatment plan. They communicate directly with your gastroenterologist to care for you. Seeing the Advanced Practice Practitioners on your physician's team can help you by facilitating care more promptly, often allowing for earlier appointments, access to diagnostic testing, procedures, and other specialty referrals.   Thank you for trusting me with your gastrointestinal care. Deanna May, FNP-C

## 2024-08-04 ENCOUNTER — Telehealth: Payer: Self-pay

## 2024-08-04 NOTE — Telephone Encounter (Signed)
-----   Message from Cathryne PARAS May sent at 08/04/2024  7:24 AM EDT ----- Regarding: FW: Peer-to-Peer Pod B- I would move appt out. Im not sure I will have the free time to do this PA today.  Cathryne, NP ----- Message ----- FromBETHA Micki Izetta JONETTA Sent: 08/04/2024   7:15 AM EDT To: Cathryne PARAS May, NP; Lbgi Pod A Triage; Lbgi Po# Subject: Peer-to-Peer                                   Good Morning, This patient's case is still pending authorization as of this morning. The Abdomen and Pelvis CT is scheduled for tomorrow. I have previously faxed all records over that are in [facility]. A peer-to-peer can be done to get the authorization today or we would need to move his apt out a few days. [facility] takes up to 12 days sometimes to get an serbia.  Here is the information you will need:  Tracking #[id number] Member ID: [id number] [facility] phone # [phone]  Thank you.

## 2024-08-04 NOTE — Telephone Encounter (Signed)
 Pt made aware. Pt rescheduled to 08/12/2024 at 3:30 PM. Pt to arrive at 1:00 PM to check in and start drinking contrast. Pt made aware.  Pt verbalized understanding with all questions answered.

## 2024-08-05 ENCOUNTER — Ambulatory Visit (HOSPITAL_COMMUNITY)

## 2024-08-11 ENCOUNTER — Telehealth: Payer: Self-pay

## 2024-08-11 NOTE — Telephone Encounter (Signed)
 Please see notes below.  Pt was notified of the denial of the CT scan by her insurance company. Pt was notified that we are working on getting the approval.  Pt was rescheduled for 08/22/2024 at 3:00 PM. Pt to arrive at [facility] at 12:45 to get checked in. Pt to drink 1st bottle of contrast at 1:00 PM and the 2nd bottle at 2:00 PM and have the CT scan at 3:00 PM. Pt made aware.  Pt verbalized understanding with all questions answered.    Please assist with the peer to peer.

## 2024-08-11 NOTE — Telephone Encounter (Signed)
-----   Message from April M sent at 08/08/2024 10:04 AM EDT ----- Regarding: Peer-to-Peer Happy Friday! This case is still pending nurse review. I know we've had to R/S her CT one time already. The scan is set for Sept 30 and they cannot guarantee again that we will have the auth by then.  The rep suggested we do a peer-to-peer. Here is the information that you will need:  [facility] [phone] Tracking # [id number] Member ID: [id number]  Thank you.

## 2024-08-12 ENCOUNTER — Ambulatory Visit (HOSPITAL_COMMUNITY)

## 2024-08-12 ENCOUNTER — Telehealth: Payer: Self-pay | Admitting: Gastroenterology

## 2024-08-12 NOTE — Telephone Encounter (Signed)
 Please see notes below Please review and advise

## 2024-08-12 NOTE — Telephone Encounter (Signed)
 Did peer to peer for CT scan abd/pelvis at 9:15 am , will submit.

## 2024-08-14 ENCOUNTER — Other Ambulatory Visit (HOSPITAL_COMMUNITY): Payer: Self-pay

## 2024-08-14 MED ORDER — HYDROCORTISONE (PERIANAL) 2.5 % EX CREA: TOPICAL_CREAM | CUTANEOUS | 1 refills | Status: AC

## 2024-08-14 MED ORDER — HYDROCORTISONE (PERIANAL) 2.5 % EX CREA: TOPICAL_CREAM | CUTANEOUS | 1 refills | Status: DC

## 2024-08-14 NOTE — Telephone Encounter (Signed)
 Hydrocortisone  suppositories are a plan exclusion

## 2024-08-14 NOTE — Addendum Note (Signed)
 Addended by: Albeiro Trompeter N on: 08/14/2024 11:28 AM   Modules accepted: Orders

## 2024-08-14 NOTE — Addendum Note (Signed)
 Addended by: Daleen Steinhaus N on: 08/14/2024 11:21 AM   Modules accepted: Orders

## 2024-08-14 NOTE — Telephone Encounter (Signed)
 Please submit PA for Hydrocortisone  suppositories. Thanks

## 2024-08-15 ENCOUNTER — Telehealth: Payer: Self-pay

## 2024-08-15 NOTE — Telephone Encounter (Signed)
 Pt is active on mychart & message has been sent.

## 2024-08-15 NOTE — Telephone Encounter (Signed)
-----   Message from Cathryne PARAS May sent at 08/15/2024 11:12 AM EDT ----- I gave her samples of Fdgard so she can try that for few weeks in addition to taking the Linzess  . Purchase OTC fdgard if it helps. If not she can contact the office for further instructions  Deanna, NP ----- Message ----- From: Marc Nyla LABOR, RN Sent: 08/15/2024   8:59 AM EDT To: Cathryne PARAS May, NP  Deanna, is there any alternative you'd like ordered or just let her know to keep doing the other OV recommendations you listed in your note? ----- Message ----- From: May, Deanna J, NP Sent: 08/14/2024  10:42 AM EDT To: April D McPeak; Karna LOISE Louder, NT; [facility] P#  I did peer to peer and told by physician she would submit everything to see if covered. So this tells me it was not approved unfortunately. I would tell patient not covered.  Cathryne, NP ----- Message ----- FromBETHA Micki Izetta JONETTA Sent: 08/14/2024   9:26 AM EDT To: Karna LOISE Louder, NT; Cathryne PARAS May, NP; [facility]#  Hello, Received letter stating they need more information before approve scan, I have sent them twice everything that is is EPIC. I would suggest a peer-to-peer to get this authorization. This is the 2nd time we've had to R/S her apt. Here is what was received from [facility]:  We have received your request for Abdomen and Pelvis CT along with additional records. However, the information provided still does not support the medical necessity of these services to make a determination on this case. Please review the documentation needed below which may allow us  to make a positive determination. Only sending daily notes may delay authorization. Missing Clinical: Most recent lab test results (such as blood work, urinalysis or any related testing) Missing Clinical: Results of sound wave test (Ultrasound) report that showed a problem or does not explain the symptoms.   TRACKING NUMBER: [id number] MEMBER ID: [id number] PTP# to call is [phone]  Thank  you.

## 2024-08-21 NOTE — Telephone Encounter (Signed)
 Last read by Marny DELENA Jack Derinda at 6:05PM on 08/15/2024.

## 2024-08-22 ENCOUNTER — Ambulatory Visit (HOSPITAL_COMMUNITY)

## 2024-09-04 ENCOUNTER — Other Ambulatory Visit: Payer: Self-pay | Admitting: Medical Genetics

## 2024-09-04 DIAGNOSIS — Z006 Encounter for examination for normal comparison and control in clinical research program: Secondary | ICD-10-CM

## 2025-02-16 ENCOUNTER — Ambulatory Visit: Admitting: Obstetrics and Gynecology
